# Patient Record
Sex: Female | Born: 1985
Health system: Southern US, Community
[De-identification: ages and names within clinical notes are randomized; demographics above are authoritative.]

## PROBLEM LIST (undated history)

## (undated) ENCOUNTER — Inpatient Hospital Stay (HOSPITAL_COMMUNITY): Payer: 59

## (undated) DIAGNOSIS — M722 Plantar fascial fibromatosis: Secondary | ICD-10-CM

## (undated) DIAGNOSIS — L409 Psoriasis, unspecified: Secondary | ICD-10-CM

## (undated) DIAGNOSIS — L219 Seborrheic dermatitis, unspecified: Secondary | ICD-10-CM

## (undated) HISTORY — DX: Plantar fascial fibromatosis: M72.2

## (undated) HISTORY — PX: REFRACTIVE SURGERY: SHX103

## (undated) HISTORY — DX: Psoriasis, unspecified: L40.9

## (undated) HISTORY — DX: Seborrheic dermatitis, unspecified: L21.9

---

## 2013-06-23 ENCOUNTER — Ambulatory Visit (INDEPENDENT_AMBULATORY_CARE_PROVIDER_SITE_OTHER): Payer: 59 | Admitting: Physician Assistant

## 2013-06-23 ENCOUNTER — Encounter: Payer: Self-pay | Admitting: Physician Assistant

## 2013-06-23 VITALS — BP 106/57 | HR 84 | Ht 62.0 in | Wt 103.0 lb

## 2013-06-23 DIAGNOSIS — Z0000 Encounter for general adult medical examination without abnormal findings: Secondary | ICD-10-CM

## 2013-06-23 NOTE — Progress Notes (Signed)
  Subjective:     Haley Kirby is a 27 y.o. female and is here for a comprehensive physical exam. The patient reports no problems.  History   Social History  . Marital Status: Unknown    Spouse Name: N/A    Number of Children: N/A  . Years of Education: N/A   Occupational History  . Not on file.   Social History Main Topics  . Smoking status: Never Smoker   . Smokeless tobacco: Not on file  . Alcohol Use: Yes  . Drug Use: No  . Sexual Activity: Yes   Other Topics Concern  . Not on file   Social History Narrative  . No narrative on file   No health maintenance topics applied.  The following portions of the patient's history were reviewed and updated as appropriate: allergies, current medications, past family history, past medical history, past social history, past surgical history and problem list.  Review of Systems A comprehensive review of systems was negative.   Objective:    BP 106/57  Pulse 84  Ht 5\' 2"  (1.575 m)  Wt 103 lb (46.72 kg)  BMI 18.83 kg/m2 General appearance: alert, cooperative and appears stated age Head: Normocephalic, without obvious abnormality, atraumatic Eyes: conjunctivae/corneas clear. PERRL, EOM's intact. Fundi benign. Ears: normal TM's and external ear canals both ears Nose: Nares normal. Septum midline. Mucosa normal. No drainage or sinus tenderness. Throat: lips, mucosa, and tongue normal; teeth and gums normal Neck: no adenopathy, no carotid bruit, no JVD, supple, symmetrical, trachea midline and thyroid not enlarged, symmetric, no tenderness/mass/nodules Back: symmetric, no curvature. ROM normal. No CVA tenderness. Lungs: normal percussion bilaterally Heart: regular rate and rhythm, S1, S2 normal, no murmur, click, rub or gallop Abdomen: soft, non-tender; bowel sounds normal; no masses,  no organomegaly Extremities: extremities normal, atraumatic, no cyanosis or edema Pulses: 2+ and symmetric Skin: Skin color, texture, turgor  normal. No rashes or lesions Lymph nodes: Cervical, supraclavicular, and axillary nodes normal. Neurologic: Grossly normal    Assessment:    Healthy female exam.      Plan:    CPE- Per pt fasting labs were done a year ago and normal. Flu and tetanus up to date. Pap smear up to date. Encouraged pt to take calcium and vitamin D daily. Encouraged regular exercise. Gave handout on health maintenance.  See After Visit Summary for Counseling Recommendations

## 2013-06-23 NOTE — Patient Instructions (Signed)

## 2013-09-06 ENCOUNTER — Encounter: Payer: Self-pay | Admitting: Physician Assistant

## 2013-09-06 ENCOUNTER — Ambulatory Visit (INDEPENDENT_AMBULATORY_CARE_PROVIDER_SITE_OTHER): Payer: 59 | Admitting: Physician Assistant

## 2013-09-06 VITALS — BP 108/68 | HR 81 | Wt 104.0 lb

## 2013-09-06 DIAGNOSIS — L218 Other seborrheic dermatitis: Secondary | ICD-10-CM

## 2013-09-06 DIAGNOSIS — L219 Seborrheic dermatitis, unspecified: Secondary | ICD-10-CM

## 2013-09-06 HISTORY — DX: Seborrheic dermatitis, unspecified: L21.9

## 2013-09-06 MED ORDER — CLOBETASOL PROPIONATE 0.05 % EX OINT: 1.0000 "application " | TOPICAL_OINTMENT | Freq: Two times a day (BID) | CUTANEOUS | Status: DC

## 2013-09-06 MED ORDER — KETOCONAZOLE 2 % EX SHAM: 1.0000 "application " | MEDICATED_SHAMPOO | CUTANEOUS | Status: DC

## 2013-09-06 NOTE — Patient Instructions (Signed)
Seborrheic Dermatitis Seborrheic dermatitis involves pink or red skin with greasy, flaky scales. This is often found on the scalp, eyebrows, nose, bearded area, and on or behind the ears. It can also occur on the central chest. It often occurs where there are more oil (sebaceous) glands. This condition is also known as dandruff. When this condition affects a baby's scalp, it is called cradle cap. It may come and go for no known reason. It can occur at any time of life from infancy to old age. CAUSES  The cause is unknown. It is not the result of too little moisture or too much oil. In some people, seborrheic dermatitis flare-ups seem to be triggered by stress. It also commonly occurs in people with certain diseases such as Parkinson's disease or HIV/AIDS. SYMPTOMS   Thick scales on the scalp.  Redness on the face or in the armpits.  The skin may seem oily or dry, but moisturizers do not help.  In infants, seborrheic dermatitis appears as scaly redness that does not seem to bother the baby. In some babies, it affects only the scalp. In others, it also affects the neck creases, armpits, groin, or behind the ears.  In adults and adolescents, seborrheic dermatitis may affect only the scalp. It may look patchy or spread out, with areas of redness and flaking. Other areas commonly affected include:  Eyebrows.  Eyelids.  Forehead.  Skin behind the ears.  Outer ears.  Chest.  Armpits.  Nose creases.  Skin creases under the breasts.  Skin between the buttocks.  Groin.  Some adults and adolescents feel itching or burning in the affected areas. DIAGNOSIS  Your caregiver can usually tell what the problem is by doing a physical exam. TREATMENT   Cortisone (steroid) ointments, creams, and lotions can help decrease inflammation.  Babies can be treated with baby oil to soften the scales, then they may be washed with baby shampoo. If this does not help, a prescription topical steroid  medicine may work.  Adults can use medicated shampoos.  Your caregiver may prescribe corticosteroid cream and shampoo containing an antifungal or yeast medicine (ketoconazole). Hydrocortisone or anti-yeast cream can be rubbed directly onto seborrheic dermatitis patches. Yeast does not cause seborrheic dermatitis, but it seems to add to the problem. In infants, seborrheic dermatitis is often worst during the first year of life. It tends to disappear on its own as the child grows. However, it may return during the teenage years. In adults and adolescents, seborrheic dermatitis tends to be a long-lasting condition that comes and goes over many years. HOME CARE INSTRUCTIONS   Use prescribed medicines as directed.  In infants, do not aggressively remove the scales or flakes on the scalp with a comb or by other means. This may lead to hair loss. SEEK MEDICAL CARE IF:   The problem does not improve from the medicated shampoos, lotions, or other medicines given by your caregiver.  You have any other questions or concerns. Document Released: 07/27/2005 Document Revised: 01/26/2012 Document Reviewed: 12/16/2009 [facility] Patient Information 2014 [place].

## 2013-09-06 NOTE — Progress Notes (Signed)
   Subjective:    Patient ID: Haley Kirby, female    DOB: 1986-05-17, 28 y.o.   MRN: [id number]  HPI Patient is a 28 year old female who presents to the clinic with itchy scalp at the back of her neck around the hairline. She's had this before and was treated with an ointment. The last time she had it was because she was using herbal essence shampoo. She recently switched to pain pain and thinks it could be causing the inflammation. She is has symptoms for the last 2 months. She's tried over-the-counter hydrocortisone and not seem to be helping.  Review of Systems     Objective:   Physical Exam  Constitutional: She is oriented to person, place, and time. She appears well-developed and well-nourished.  HENT:  Head: Normocephalic and atraumatic.  Cardiovascular: Normal rate.   Neurological: She is alert and oriented to person, place, and time.  Skin:  Scalp line at back of neck present with erythematous scaly patches.   Psychiatric: She has a normal mood and affect. Her behavior is normal.          Assessment & Plan:  Seborrheic dermatitis- treated with she did consult shampoo twice a week and clobetasol ointment up to twice a day. Patient given handout on seborrheic dermatitis. Followup if not improving in the next 3-4 weeks.

## 2013-10-02 ENCOUNTER — Telehealth: Payer: Self-pay | Admitting: *Deleted

## 2013-10-02 NOTE — Telephone Encounter (Signed)
Pt left message stating that she has the implanon & she had a period that started on the 6th and went to the 14th, the a few days ago had another period.  Both have been pretty heavy.  She wants to know if this is normal or if she needs to come in.  Please advise.

## 2013-10-03 NOTE — Telephone Encounter (Signed)
Pt.notified

## 2013-10-03 NOTE — Telephone Encounter (Signed)
It is not what we want to happen but is a common side effect of implanon. Some people have had taken out due to unpredictable bleeding. It is not worrisome but also not a fun side effect. You do not need to see me. Sometimes this will regulate itself. i would contact OB if continues and maybe discuss other birth control options.

## 2013-10-23 ENCOUNTER — Other Ambulatory Visit: Payer: Self-pay | Admitting: Physician Assistant

## 2013-10-25 LAB — URINE CULTURE

## 2013-12-07 ENCOUNTER — Encounter: Payer: 59 | Admitting: Obstetrics & Gynecology

## 2013-12-14 ENCOUNTER — Other Ambulatory Visit: Payer: Self-pay | Admitting: Physician Assistant

## 2014-05-04 ENCOUNTER — Telehealth: Payer: Self-pay | Admitting: *Deleted

## 2014-05-04 ENCOUNTER — Other Ambulatory Visit: Payer: Self-pay | Admitting: Physician Assistant

## 2014-05-04 MED ORDER — FLUOCINONIDE 0.05 % EX GEL: 1.0000 "application " | Freq: Two times a day (BID) | CUTANEOUS | Status: DC

## 2014-05-04 NOTE — Telephone Encounter (Signed)
Pt left vm stating that the shampoo & cream you gave her for seborrheic dermatitis before hasn't really helped.  She stated on the vm that a previous dr had given her nizerol shampoo & bactroban and that had worked.  I called pt back to let her know that you had prescribed the same shampoo.  Do you have any other suggestions or do you need to her come in for a visit? Please advise.

## 2014-05-04 NOTE — Telephone Encounter (Signed)
LMOM for pt to return call. 

## 2014-05-04 NOTE — Telephone Encounter (Signed)
Try different steroid gel for scalp called lidex.  Try with OTC coal tar shampoo 3 times weekly lather and rinse.

## 2014-05-07 NOTE — Telephone Encounter (Signed)
Pt.notified

## 2014-05-14 ENCOUNTER — Encounter: Payer: Self-pay | Admitting: Physician Assistant

## 2014-05-14 ENCOUNTER — Ambulatory Visit (INDEPENDENT_AMBULATORY_CARE_PROVIDER_SITE_OTHER): Payer: 59 | Admitting: Physician Assistant

## 2014-05-14 VITALS — BP 99/65 | HR 76 | Wt 106.0 lb

## 2014-05-14 DIAGNOSIS — L600 Ingrowing nail: Secondary | ICD-10-CM

## 2014-05-14 NOTE — Progress Notes (Signed)
   Subjective:    Patient ID: Haley Kirby, female    DOB: 1985/10/01, 28 y.o.   MRN: [id number]  HPI Pt presents to the clinic with right great ingrown toenail. She has them off and on. She most of the time can get rid of herself. She has not been able to do anything with this on. She has on and off pain for last 3-6 months. She wants it gone.    Review of Systems  All other systems reviewed and are negative.      Objective:   Physical Exam  Constitutional: She appears well-developed and well-nourished.  Pulmonary/Chest: Effort normal and breath sounds normal.  Skin:  Right great toe lateral edge ingrown toenail. Slight redness and erythema of lateral nail edge.           Assessment & Plan:  Right ingrown great toenail- Toenail Avulsion Procedure Note  Pre-operative Diagnosis: Right Ingrown Great toenail lateral nail edge  Post-operative Diagnosis: right Ingrown Great toenail lateral nail edge  Indications: pain/tenderness/redness  Anesthesia: Lidocaine 1% without epinephrine without added sodium bicarbonate  Procedure Details  History of allergy to iodine: no   The risks (including bleeding and infection) and benefits of the  procedure and Verbal informed consent obtained.  After digital block anesthesia was obtained, a tourniquet was applied for hemostasis during the procedure.  After prepping with Betadine, the offending edge of the nail was freed from the nailbed and perionychium, and then split with scissors and removed with  forceps.  All visible granulation tissue is debrided. Antibiotic and bulky dressing was applied.   Findings: Ingrown toenail  Complications: pain.  Plan: 1. Soak the foot twice daily. Change dressing twice daily until healed over. 2. Warning signs of infection were reviewed.   3. Recommended that the patient use OTC acetaminophen as needed for pain.

## 2014-05-14 NOTE — Patient Instructions (Signed)
Ingrown Toenail An ingrown toenail occurs when the sharp edge of your toenail grows into the skin. Causes of ingrown toenails include toenails clipped too far back or poorly fitting shoes. Activities involving sudden stops (basketball, tennis) causing "toe jamming" may lead to an ingrown nail. HOME CARE INSTRUCTIONS   Soak the whole foot in warm soapy water for 20 minutes, 3 times per day.  You may lift the edge of the nail away from the sore skin by wedging a small piece of cotton under the corner of the nail. Be careful not to dig (traumatize) and cause more injury to the area.  Wear shoes that fit well. While the ingrown nail is causing problems, sandals may be beneficial.  Trim your toenails regularly and carefully. Cut your toenails straight across, not in a curve. This will prevent injury to the skin at the corners of the toenail.  Keep your feet clean and dry.  Crutches may be helpful early in treatment if walking is painful.  Antibiotics, if prescribed, should be taken as directed.  Return for a wound check in 2 days or as directed.  Only take over-the-counter or prescription medicines for pain, discomfort, or fever as directed by your caregiver. SEEK IMMEDIATE MEDICAL CARE IF:   You have a fever.  You have increasing pain, redness, swelling, or heat at the wound site.  Your toe is not better in 7 days. If conservative treatment is not successful, surgical removal of a portion or all of the nail may be necessary. MAKE SURE YOU:   Understand these instructions.  Will watch your condition.  Will get help right away if you are not doing well or get worse. Document Released: 07/24/2000 Document Revised: 10/19/2011 Document Reviewed: 07/18/2008 [employer] Patient Information 2015 [employer]. This information is not intended to replace advice given to you by your health care provider. Make sure you discuss any questions you have with your health care provider.  

## 2014-07-16 ENCOUNTER — Ambulatory Visit (INDEPENDENT_AMBULATORY_CARE_PROVIDER_SITE_OTHER): Payer: 59 | Admitting: Physician Assistant

## 2014-07-16 ENCOUNTER — Encounter: Payer: Self-pay | Admitting: Physician Assistant

## 2014-07-16 VITALS — BP 104/69 | HR 75 | Ht 62.0 in | Wt 106.0 lb

## 2014-07-16 DIAGNOSIS — Z0000 Encounter for general adult medical examination without abnormal findings: Secondary | ICD-10-CM

## 2014-07-16 DIAGNOSIS — L219 Seborrheic dermatitis, unspecified: Secondary | ICD-10-CM

## 2014-07-16 DIAGNOSIS — L218 Other seborrheic dermatitis: Secondary | ICD-10-CM

## 2014-07-16 DIAGNOSIS — Z131 Encounter for screening for diabetes mellitus: Secondary | ICD-10-CM

## 2014-07-16 DIAGNOSIS — Z13220 Encounter for screening for lipoid disorders: Secondary | ICD-10-CM

## 2014-07-16 MED ORDER — CICLOPIROX 1 % EX SHAM: MEDICATED_SHAMPOO | CUTANEOUS | Status: DC

## 2014-07-16 MED ORDER — AZITHROMYCIN 250 MG PO TABS: ORAL_TABLET | ORAL | Status: DC

## 2014-07-16 NOTE — Patient Instructions (Signed)

## 2014-07-18 NOTE — Progress Notes (Signed)
  Subjective:     Haley Kirby is a 28 y.o. female and is here for a comprehensive physical exam. The patient reports problems - continues to have problems with itchy, scaly scalp. using creams and shampoos given previously. does seem to help but does not go away. .  History   Social History  . Marital Status: Unknown    Spouse Name: N/A    Number of Children: N/A  . Years of Education: N/A   Occupational History  . Not on file.   Social History Main Topics  . Smoking status: Never Smoker   . Smokeless tobacco: Not on file  . Alcohol Use: Yes  . Drug Use: No  . Sexual Activity: Yes   Other Topics Concern  . Not on file   Social History Narrative   Health Maintenance  Topic Date Due  . Samul Dada  11/26/2006  . PAP SMEAR  08/10/2013  . INFLUENZA VACCINE  03/11/2015    The following portions of the patient's history were reviewed and updated as appropriate: allergies, current medications, past family history, past medical history, past social history, past surgical history and problem list.  Review of Systems A comprehensive review of systems was negative.   Objective:    General appearance: alert and cooperative Head: Normocephalic, without obvious abnormality, atraumatic Eyes: conjunctivae/corneas clear. PERRL, EOM's intact. Fundi benign. Ears: normal TM's and external ear canals both ears Nose: Nares normal. Septum midline. Mucosa normal. No drainage or sinus tenderness. Throat: lips, mucosa, and tongue normal; teeth and gums normal Neck: no adenopathy, no carotid bruit, no JVD, supple, symmetrical, trachea midline and thyroid not enlarged, symmetric, no tenderness/mass/nodules Back: symmetric, no curvature. ROM normal. No CVA tenderness. Lungs: clear to auscultation bilaterally Heart: regular rate and rhythm, S1, S2 normal, no murmur, click, rub or gallop Abdomen: soft, non-tender; bowel sounds normal; no masses,  no organomegaly Extremities: extremities normal,  atraumatic, no cyanosis or edema Pulses: 2+ and symmetric Skin: Skin color, texture, turgor normal. No rashes or lesions Lymph nodes: Cervical, supraclavicular, and axillary nodes normal. Neurologic: Grossly normal    Scaly, dry, itchy, erythematous patches at base of scalp.    Assessment:    Healthy female exam.      Plan:  CPE- vaccines up to date. Pap up to date. Fasting labs ordered. Encouraged regular diet and exercise. Calcium and vitamin D encouraged 1200mg  and vitamin d 800 units.   Seborrheic dermaitis- gave new shampoo to try in combination with lidex gel.    See After Visit Summary for Counseling Recommendations

## 2014-07-19 ENCOUNTER — Ambulatory Visit: Payer: Self-pay

## 2014-07-19 LAB — LIPID PANEL
Cholesterol: 155 mg/dL (ref 0–200)
Cholesterol: 155 mg/dL (ref 0–200)
HDL: 43 mg/dL (ref 35–70)
HDL: 43 mg/dL (ref 40–60)
LDL Cholesterol: 100 mg/dL
Ldl Cholesterol, Calc: 100 mg/dL (ref 0–100)
TRIGLYCERIDES: 60 mg/dL (ref 0–200)
Triglycerides: 60 mg/dL (ref 40–160)
VLDL CHOLESTEROL, CALC: 12 mg/dL (ref 5–40)

## 2014-07-19 LAB — COMPREHENSIVE METABOLIC PANEL
ALK PHOS: 57 U/L
ALT: 27 U/L
ANION GAP: 7 (ref 7–16)
AST: 26 U/L (ref 15–37)
Albumin: 3.9 g/dL (ref 3.4–5.0)
BUN: 9 mg/dL (ref 7–18)
Bilirubin,Total: 0.7 mg/dL (ref 0.2–1.0)
CALCIUM: 8.7 mg/dL (ref 8.5–10.1)
CHLORIDE: 106 mmol/L (ref 98–107)
Co2: 28 mmol/L (ref 21–32)
Creatinine: 0.8 mg/dL (ref 0.60–1.30)
EGFR (Non-African Amer.): >60
GLUCOSE: 81 mg/dL (ref 65–99)
Osmolality: 279 (ref 275–301)
POTASSIUM: 4.1 mmol/L (ref 3.5–5.1)
Sodium: 141 mmol/L (ref 136–145)
Total Protein: 7.2 g/dL (ref 6.4–8.2)

## 2014-07-19 LAB — CMP14+LP+1AC+CBC/D/PLT+TSH
ALBUMIN: 3.9
CO2, serum: 28
Calcium: 8.7 mg/dL
Chloride, Serum: 106
PROTEIN, TOTAL - PEBFLD: 7.2
Total Bilirubin: 0.7 mg/dL

## 2014-07-19 LAB — HEPATIC FUNCTION PANEL
ALT: 27 U/L (ref 7–35)
AST: 26 U/L (ref 13–35)
Alkaline Phosphatase: 57 U/L (ref 25–125)

## 2014-07-19 LAB — BASIC METABOLIC PANEL
BUN: 9 mg/dL (ref 4–21)
Creatinine: 0.8 mg/dL (ref 0.5–1.1)
GLUCOSE: 81 mg/dL
Potassium: 4.1 mmol/L (ref 3.4–5.3)
Sodium: 141 mmol/L (ref 137–147)

## 2014-07-20 ENCOUNTER — Other Ambulatory Visit: Payer: Self-pay | Admitting: *Deleted

## 2014-07-27 ENCOUNTER — Telehealth: Payer: Self-pay | Admitting: Physician Assistant

## 2014-07-27 NOTE — Telephone Encounter (Signed)
Got labs from [facility]: LDL 100, HDL 43, TG looks great, glucose great, kidney, liver all good.   Wonderful labs.

## 2014-07-27 NOTE — Telephone Encounter (Signed)
Left results on vm.

## 2014-09-10 ENCOUNTER — Other Ambulatory Visit: Payer: Self-pay | Admitting: Physician Assistant

## 2014-10-19 ENCOUNTER — Encounter: Payer: Self-pay | Admitting: Physician Assistant

## 2014-11-12 ENCOUNTER — Telehealth: Payer: Self-pay | Admitting: *Deleted

## 2014-11-13 NOTE — Telephone Encounter (Signed)
Did treatment back in 2015 help? Has it been ongoing for over a year? Does it come and go or stay constant?

## 2014-11-14 ENCOUNTER — Other Ambulatory Visit: Payer: Self-pay | Admitting: Physician Assistant

## 2014-11-14 DIAGNOSIS — L219 Seborrheic dermatitis, unspecified: Secondary | ICD-10-CM

## 2014-11-14 NOTE — Telephone Encounter (Signed)
Haley Kirby, CMA at 11/14/2014 4:44 PM     Status: Signed       Expand All Collapse All   Spoke to pt and she said that the treatment from last year didn't help and it's been ongoing for over a year now & it's constant.

## 2014-11-14 NOTE — Telephone Encounter (Signed)
Treated with best medication for seborrheic dermatitis/atopic dermititis I will send to dermatology.

## 2014-11-14 NOTE — Telephone Encounter (Signed)
Spoke to pt and she said that the treatment from last year didn't help and it's been ongoing for over a year now & it's constant.

## 2014-11-15 NOTE — Telephone Encounter (Signed)
LMOM notifying pt of referral. 

## 2014-12-14 ENCOUNTER — Encounter: Payer: Self-pay | Admitting: Physician Assistant

## 2014-12-14 DIAGNOSIS — L409 Psoriasis, unspecified: Secondary | ICD-10-CM | POA: Insufficient documentation

## 2014-12-14 HISTORY — DX: Psoriasis, unspecified: L40.9

## 2015-01-21 ENCOUNTER — Ambulatory Visit (INDEPENDENT_AMBULATORY_CARE_PROVIDER_SITE_OTHER): Payer: 59 | Admitting: Sports Medicine

## 2015-01-21 ENCOUNTER — Encounter: Payer: Self-pay | Admitting: Sports Medicine

## 2015-01-21 VITALS — BP 115/72 | HR 75 | Ht 62.0 in | Wt 111.0 lb

## 2015-01-21 DIAGNOSIS — M722 Plantar fascial fibromatosis: Secondary | ICD-10-CM

## 2015-01-21 HISTORY — DX: Plantar fascial fibromatosis: M72.2

## 2015-01-21 MED ORDER — MELOXICAM 15 MG PO TABS: ORAL_TABLET | ORAL | Status: DC

## 2015-01-21 NOTE — Progress Notes (Signed)
   Subjective:    I'm seeing this patient as a consultation for:  Iran Planas, PA-C  CC: right foot pain  HPI: For several months this pleasant 29 year old female has had pain that she localizes on the plantar aspect of the right foot, moderate, persistent without radiation. 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  Past medical history, Surgical history, Family history not pertinant except as noted below, Social history, Allergies, and medications have been entered into the medical record, reviewed, and no changes needed.   Review of Systems: No headache, visual changes, nausea, vomiting, diarrhea, constipation, dizziness, abdominal pain, skin rash, fevers, chills, night sweats, weight loss, swollen lymph nodes, body aches, joint swelling, muscle aches, chest pain, shortness of breath, mood changes, visual or auditory hallucinations.   Objective:   General: Well Developed, well nourished, and in no acute distress.  Neuro/Psych: Alert and oriented x3, extra-ocular muscles intact, able to move all 4 extremities, sensation grossly intact. Skin: Warm and dry, no rashes noted.  Respiratory: Not using accessory muscles, speaking in full sentences, trachea midline.  Cardiovascular: Pulses palpable, no extremity edema. Abdomen: Does not appear distended. Right Foot: No visible erythema or swelling. Range of motion is full in all directions. Strength is 5/5 in all directions. No hallux valgus. No pes cavus or pes planus. No abnormal callus noted. No pain over the navicular prominence, or base of fifth metatarsal. No tenderness to palpation of the calcaneal insertion of plantar fascia. No pain at the Achilles insertion. No pain over the calcaneal bursa. No pain of the retrocalcaneal bursa. No tenderness to palpation over the tarsals, metatarsals, or phalanges. No hallux rigidus or limitus. No tenderness palpation over interphalangeal joints. No pain with  compression of the metatarsal heads. Visible breakdown of the transverse arch with clawing of the toes and abnormal callus between the second through fourth metatarsal heads Neurovascularly intact distally.  Impression and Recommendations:   This case required medical decision making of moderate complexity.

## 2015-01-21 NOTE — Assessment & Plan Note (Signed)
Return for custom orthotics. Meloxicam, home rehabilitation exercises.

## 2015-02-05 ENCOUNTER — Ambulatory Visit (INDEPENDENT_AMBULATORY_CARE_PROVIDER_SITE_OTHER): Payer: 59 | Admitting: Sports Medicine

## 2015-02-05 ENCOUNTER — Encounter: Payer: Self-pay | Admitting: Sports Medicine

## 2015-02-05 VITALS — BP 107/67 | HR 73 | Wt 112.0 lb

## 2015-02-05 DIAGNOSIS — M722 Plantar fascial fibromatosis: Secondary | ICD-10-CM

## 2015-02-05 NOTE — Progress Notes (Signed)

## 2015-02-05 NOTE — Assessment & Plan Note (Signed)
50-75% better with meloxicam and rehabilitation exercises. Custom orthotics as above. Return in one month.

## 2015-03-12 ENCOUNTER — Ambulatory Visit (INDEPENDENT_AMBULATORY_CARE_PROVIDER_SITE_OTHER): Payer: 59 | Admitting: Sports Medicine

## 2015-03-12 ENCOUNTER — Encounter: Payer: Self-pay | Admitting: Sports Medicine

## 2015-03-12 VITALS — BP 106/68 | HR 79 | Ht 62.0 in | Wt 113.0 lb

## 2015-03-12 DIAGNOSIS — M722 Plantar fascial fibromatosis: Secondary | ICD-10-CM | POA: Diagnosis not present

## 2015-03-12 NOTE — Progress Notes (Signed)
  Subjective:    CC: Follow-up  HPI: Lashe returns, she has done the rehabilitation exercises and has custom orthotics for plantar fasciitis, overall she is pain-free, only has a bit of discomfort at the end of the day, she feels as though she cannot fit the orthotics in a couple of her shoes.  Past medical history, Surgical history, Family history not pertinant except as noted below, Social history, Allergies, and medications have been entered into the medical record, reviewed, and no changes needed.   Review of Systems: No fevers, chills, night sweats, weight loss, chest pain, or shortness of breath.   Objective:    General: Well Developed, well nourished, and in no acute distress.  Neuro: Alert and oriented x3, extra-ocular muscles intact, sensation grossly intact.  HEENT: Normocephalic, atraumatic, pupils equal round reactive to light, neck supple, no masses, no lymphadenopathy, thyroid nonpalpable.  Skin: Warm and dry, no rashes. Cardiac: Regular rate and rhythm, no murmurs rubs or gallops, no lower extremity edema.  Respiratory: Clear to auscultation bilaterally. Not using accessory muscles, speaking in full sentences.  I shortened the orthotics approximately 1/4 inch and planed them down at the tip  Impression and Recommendations:    I spent 25 minutes with this patient, greater than 50% was face-to-face time counseling regarding the above diagnoses

## 2015-03-12 NOTE — Assessment & Plan Note (Signed)
Over 90% improved, essentially pain free, still has a bit of difficulty fitting her orthotics in her flats and Danskos. I planed down and shortened the orthotics a bit, she will also return to pick up a night splints

## 2015-07-02 ENCOUNTER — Encounter: Payer: Self-pay | Admitting: Sports Medicine

## 2015-07-02 ENCOUNTER — Ambulatory Visit (INDEPENDENT_AMBULATORY_CARE_PROVIDER_SITE_OTHER): Payer: 59 | Admitting: Sports Medicine

## 2015-07-02 VITALS — BP 109/72 | HR 65 | Wt 113.0 lb

## 2015-07-02 DIAGNOSIS — M722 Plantar fascial fibromatosis: Secondary | ICD-10-CM

## 2015-07-02 NOTE — Progress Notes (Signed)

## 2015-07-02 NOTE — Assessment & Plan Note (Addendum)
Doing well, new set of custom orthotics as above.

## 2015-07-19 ENCOUNTER — Ambulatory Visit (INDEPENDENT_AMBULATORY_CARE_PROVIDER_SITE_OTHER): Payer: 59 | Admitting: Physician Assistant

## 2015-07-19 ENCOUNTER — Other Ambulatory Visit (HOSPITAL_COMMUNITY)
Admission: RE | Admit: 2015-07-19 | Discharge: 2015-07-19 | Disposition: A | Discharge status: Home / Self Care | Payer: 59 | Source: Ambulatory Visit | Attending: Family Medicine | Admitting: Family Medicine

## 2015-07-19 ENCOUNTER — Encounter: Payer: Self-pay | Admitting: Physician Assistant

## 2015-07-19 VITALS — BP 111/51 | HR 74 | Ht 62.0 in | Wt 112.0 lb

## 2015-07-19 DIAGNOSIS — Z131 Encounter for screening for diabetes mellitus: Secondary | ICD-10-CM | POA: Diagnosis not present

## 2015-07-19 DIAGNOSIS — Z0000 Encounter for general adult medical examination without abnormal findings: Secondary | ICD-10-CM

## 2015-07-19 DIAGNOSIS — Z01419 Encounter for gynecological examination (general) (routine) without abnormal findings: Secondary | ICD-10-CM | POA: Insufficient documentation

## 2015-07-19 LAB — COMPLETE METABOLIC PANEL WITH GFR
ALBUMIN: 4.4 g/dL (ref 3.6–5.1)
ALK PHOS: 43 U/L (ref 33–115)
ALT: 16 U/L (ref 6–29)
AST: 19 U/L (ref 10–30)
BILIRUBIN TOTAL: 0.6 mg/dL (ref 0.2–1.2)
BUN: 9 mg/dL (ref 7–25)
CALCIUM: 9.3 mg/dL (ref 8.6–10.2)
CHLORIDE: 101 mmol/L (ref 98–110)
CO2: 26 mmol/L (ref 20–31)
CREATININE: 0.82 mg/dL (ref 0.50–1.10)
GFR, Est Non African American: >89 mL/min (ref >=60)
Glucose, Bld: 76 mg/dL (ref 65–99)
Potassium: 4.2 mmol/L (ref 3.5–5.3)
Sodium: 137 mmol/L (ref 135–146)
TOTAL PROTEIN: 7 g/dL (ref 6.1–8.1)

## 2015-07-19 MED ORDER — NORETHINDRONE ACET-ETHINYL EST 1-20 MG-MCG PO TABS: 1.0000 | ORAL_TABLET | Freq: Every day | ORAL | Status: DC

## 2015-07-19 NOTE — Progress Notes (Signed)
  Subjective:     Haley Kirby is a 29 y.o. female and is here for a comprehensive physical exam. The patient reports no problems.  Social History   Social History  . Marital Status: Married    Spouse Name: N/A  . Number of Children: N/A  . Years of Education: N/A   Occupational History  . Not on file.   Social History Main Topics  . Smoking status: Never Smoker   . Smokeless tobacco: Not on file  . Alcohol Use: Yes  . Drug Use: No  . Sexual Activity: Yes   Other Topics Concern  . Not on file   Social History Narrative   Health Maintenance  Topic Date Due  . PAP SMEAR  08/10/2013  . HIV Screening  07/18/2016 (Originally 11/26/2002)  . INFLUENZA VACCINE  03/10/2016  . TETANUS/TDAP  03/10/2025    The following portions of the patient's history were reviewed and updated as appropriate: allergies, current medications, past family history, past medical history, past social history, past surgical history and problem list.  Review of Systems Pertinent items noted in HPI and remainder of comprehensive ROS otherwise negative.   Objective:    BP 111/51 mmHg  Pulse 74  Ht 5\' 2"  (1.575 m)  Wt 112 lb (50.803 kg)  BMI 20.48 kg/m2  LMP 07/09/2015 General appearance: alert, cooperative and appears stated age Head: Normocephalic, without obvious abnormality, atraumatic Eyes: conjunctivae/corneas clear. PERRL, EOM's intact. Fundi benign. Ears: normal TM's and external ear canals both ears Nose: Nares normal. Septum midline. Mucosa normal. No drainage or sinus tenderness. Throat: lips, mucosa, and tongue normal; teeth and gums normal Neck: no adenopathy, no carotid bruit, no JVD, supple, symmetrical, trachea midline and thyroid not enlarged, symmetric, no tenderness/mass/nodules Back: symmetric, no curvature. ROM normal. No CVA tenderness. Lungs: clear to auscultation bilaterally Breasts: normal appearance, no masses or tenderness Heart: regular rate and rhythm, S1, S2 normal, no  murmur, click, rub or gallop Abdomen: soft, non-tender; bowel sounds normal; no masses,  no organomegaly Pelvic: cervix normal in appearance, external genitalia normal, no adnexal masses or tenderness, no cervical motion tenderness, uterus normal size, shape, and consistency and vagina normal without discharge Extremities: extremities normal, atraumatic, no cyanosis or edema Pulses: 2+ and symmetric Skin: Skin color, texture, turgor normal. No rashes or lesions Lymph nodes: Cervical, supraclavicular, and axillary nodes normal. Neurologic: Grossly normal    Assessment:    Healthy female exam.      Plan:    CPE- LdL wonderful 1 year ago will not check this year. cmp ordered to screen for diabetes. HIV screening declined. Pap done today. Discussed vitamin D 800 units and calcium 1500mg  daily. Continue daily exercise.  See After Visit Summary for Counseling Recommendations

## 2015-07-19 NOTE — Patient Instructions (Signed)

## 2015-07-22 LAB — CYTOLOGY - PAP

## 2015-08-15 ENCOUNTER — Ambulatory Visit: Payer: Self-pay | Admitting: Physician Assistant

## 2015-08-15 ENCOUNTER — Encounter: Payer: Self-pay | Admitting: Physician Assistant

## 2015-08-15 VITALS — BP 100/70 | HR 74 | Resp 98

## 2015-08-15 DIAGNOSIS — M26609 Unspecified temporomandibular joint disorder, unspecified side: Secondary | ICD-10-CM

## 2015-08-15 MED ORDER — METHYLPREDNISOLONE 4 MG PO TBPK: ORAL_TABLET | ORAL | Status: DC

## 2015-08-15 NOTE — Progress Notes (Signed)
S: c/o left jaw pain, has tmj, has been taking tylenol and motrin without relief, no known injury, pain increased with opening and closing jaw, no popping or clicking  O: vitals wnl, nad, left jaw a little tender at tmj, full rom, bite is normal, lungs c t a, cv rrr  A: tmj syndrome  P: medrol dose pack, f/u with reg dentist, ask about mouth guard

## 2015-10-29 ENCOUNTER — Encounter: Payer: Self-pay | Admitting: Physician Assistant

## 2015-10-29 ENCOUNTER — Ambulatory Visit (INDEPENDENT_AMBULATORY_CARE_PROVIDER_SITE_OTHER): Payer: 59 | Admitting: Physician Assistant

## 2015-10-29 VITALS — BP 105/57 | Temp 97.9°F | Ht 62.0 in | Wt 112.0 lb

## 2015-10-29 DIAGNOSIS — J029 Acute pharyngitis, unspecified: Secondary | ICD-10-CM | POA: Diagnosis not present

## 2015-10-29 DIAGNOSIS — Z2089 Contact with and (suspected) exposure to other communicable diseases: Secondary | ICD-10-CM

## 2015-10-29 DIAGNOSIS — Z20818 Contact with and (suspected) exposure to other bacterial communicable diseases: Secondary | ICD-10-CM

## 2015-10-29 MED ORDER — AMOXICILLIN-POT CLAVULANATE 875-125 MG PO TABS: 1.0000 | ORAL_TABLET | Freq: Two times a day (BID) | ORAL | Status: DC

## 2015-10-29 NOTE — Progress Notes (Signed)
   Subjective:    Patient ID: Haley Kirby, female    DOB: 10/26/1985, 30 y.o.   MRN: [id number]  HPI Pt presents to the clinic with ST for 1 day. Her daughter was diagnosed with strep yesterday. All weekend long they shared drinks and food. No fever, chills, body aches. Not tried anything to make better. No cough.    Review of Systems  All other systems reviewed and are negative.      Objective:   Physical Exam  Constitutional: She is oriented to person, place, and time. She appears well-developed and well-nourished.  HENT:  Head: Normocephalic and atraumatic.  Right Ear: External ear normal.  Left Ear: External ear normal.  Nose: Nose normal.  TM's clear.  Erythematous oropharynx without tonsiliar swelling or exudate.   Neck: Normal range of motion. Neck supple.  Mildly enlarged and tender bilateral anterior cervical lymph nodes.   Cardiovascular: Normal rate, regular rhythm and normal heart sounds.   Pulmonary/Chest: Effort normal and breath sounds normal. She has no wheezes.  Lymphadenopathy:    She has cervical adenopathy.  Neurological: She is alert and oriented to person, place, and time.  Psychiatric: She has a normal mood and affect. Her behavior is normal.          Assessment & Plan:  Exposure to strep/ST- will treat with augmentin for 10 days. Discussed symptomatic care. Gargle with salt water.

## 2015-11-06 ENCOUNTER — Telehealth: Payer: Self-pay | Admitting: Family Medicine

## 2015-11-06 MED ORDER — OSELTAMIVIR PHOSPHATE 75 MG PO CAPS: 75.0000 mg | ORAL_CAPSULE | Freq: Every day | ORAL | Status: DC

## 2015-11-06 MED FILL — OSELTAMIVIR PHOS 75 MG CAP: 75 | 10 days supply | Qty: 10 | Fill #0

## 2015-11-06 NOTE — Telephone Encounter (Signed)
Son tested positive

## 2015-11-06 NOTE — Addendum Note (Signed)
Addended by: Marcial Pacas on: 11/06/2015 02:40 PM   Modules accepted: Orders

## 2015-12-21 ENCOUNTER — Encounter: Payer: Self-pay | Admitting: Sports Medicine

## 2016-02-05 ENCOUNTER — Encounter: Payer: Self-pay | Admitting: Physician Assistant

## 2016-02-05 ENCOUNTER — Ambulatory Visit: Payer: Self-pay | Admitting: Physician Assistant

## 2016-02-05 VITALS — BP 100/70 | HR 72 | Temp 98.4°F

## 2016-02-05 DIAGNOSIS — R300 Dysuria: Secondary | ICD-10-CM

## 2016-02-05 DIAGNOSIS — R319 Hematuria, unspecified: Secondary | ICD-10-CM

## 2016-02-05 DIAGNOSIS — N390 Urinary tract infection, site not specified: Secondary | ICD-10-CM

## 2016-02-05 LAB — POCT URINALYSIS DIPSTICK
BILIRUBIN UA: NEGATIVE
Glucose, UA: NEGATIVE
Ketones, UA: NEGATIVE
NITRITE UA: NEGATIVE
PH UA: 6
Protein, UA: NEGATIVE
UROBILINOGEN UA: 0.2

## 2016-02-05 MED ORDER — CIPROFLOXACIN HCL 500 MG PO TABS: 500.0000 mg | ORAL_TABLET | Freq: Two times a day (BID) | ORAL | Status: DC

## 2016-02-05 NOTE — Progress Notes (Signed)
S:  C/o uti sx for 2 days, burning, urgency, frequency, urine was pink colored yesterday, took azo with a little relief today,  denies vaginal discharge, abdominal pain or flank pain:  Remainder ros neg  O:  Vitals wnl, nad, no cva tenderness, back nontender, lungs c t a,cv rrr, abd soft nontender, bs normal, n/v intact  A: uti  P: cipro 500mg  bid x 3d, increase water intake, add cranberry juice, return if not improving in 2 -3 days, return earlier if worsening, discussed pyelonephritis sx

## 2016-04-02 ENCOUNTER — Ambulatory Visit: Payer: Self-pay | Admitting: Physician Assistant

## 2016-04-02 ENCOUNTER — Encounter: Payer: Self-pay | Admitting: Physician Assistant

## 2016-04-02 VITALS — BP 98/72 | HR 60 | Temp 98.4°F

## 2016-04-02 DIAGNOSIS — H60392 Other infective otitis externa, left ear: Secondary | ICD-10-CM

## 2016-04-02 MED ORDER — NEOMYCIN-POLYMYXIN-HC 3.5-10000-1 OT SOLN: 4.0000 [drp] | Freq: Four times a day (QID) | OTIC | 0 refills | Status: DC

## 2016-04-02 NOTE — Progress Notes (Signed)
S: c/o left ear pain, was at the beach over the weekend and in the pool a lot, states ear started to hurt yesterday and couldn't sleep on that side last night due to pain, no fever/chills/cough/congestion, no drainage from her ear  O: vitals wnl, nad, left ear canal swollen, tms clear b/l, nasal mucosa wnl, throat wnl, neck supple no lymph lungs c ta , cv rrr  A: acute otitis externa  P: cortisporin otic drop

## 2016-04-06 DIAGNOSIS — D225 Melanocytic nevi of trunk: Secondary | ICD-10-CM | POA: Diagnosis not present

## 2016-04-06 DIAGNOSIS — L408 Other psoriasis: Secondary | ICD-10-CM | POA: Diagnosis not present

## 2016-06-22 ENCOUNTER — Ambulatory Visit: Payer: Self-pay | Admitting: Physician Assistant

## 2016-06-22 ENCOUNTER — Encounter: Payer: Self-pay | Admitting: Physician Assistant

## 2016-06-22 VITALS — BP 110/60 | HR 85 | Temp 98.5°F

## 2016-06-22 DIAGNOSIS — L03031 Cellulitis of right toe: Secondary | ICD-10-CM

## 2016-06-22 MED ORDER — SULFAMETHOXAZOLE-TRIMETHOPRIM 800-160 MG PO TABS: 1.0000 | ORAL_TABLET | Freq: Two times a day (BID) | ORAL | 0 refills | Status: DC

## 2016-06-22 NOTE — Progress Notes (Addendum)
S: 2 weeks ago someone stepped on her toe with cleat while playing soccer, over the weekend the toenail turned blue so she poked a hole in it to relieve the pressure, bled for about 75minutes, now area is red and swollen, no fever/chills,   O: vitals wnl, nad, skin around nail and distal part of toe on surface is red swollen, tender, a little warm, does not wrap around under toe, n/v intact  A: cellulitis  P: septra ds, warm water soaks  Asked pt yesterday if there was any chance she was pregnant and she said no, she got worried and took a pregnancy test this morning which was +, told pt to not take the septra and we will send rx for keflex to pharmacy

## 2016-06-23 ENCOUNTER — Telehealth: Payer: Self-pay | Admitting: Physician Assistant

## 2016-06-23 MED ORDER — CEPHALEXIN 500 MG PO CAPS: 500.0000 mg | ORAL_CAPSULE | Freq: Three times a day (TID) | ORAL | 0 refills | Status: DC

## 2016-06-23 NOTE — Addendum Note (Signed)
Addended by: Versie Starks on: 06/23/2016 09:25 AM   Modules accepted: Orders

## 2016-06-23 NOTE — Telephone Encounter (Signed)
Will send in rx for keflex, tell her to not take the septra

## 2016-06-23 NOTE — Progress Notes (Signed)
Patient contacted office this morning to inform us that this morning she took a pregnancy test which was positive and was wondering if she can still take the antibiotic prescribed to her . Per Manuela Schwartz will send something else and not to take the septra. Patient acknowledge understanding

## 2016-06-25 ENCOUNTER — Ambulatory Visit (INDEPENDENT_AMBULATORY_CARE_PROVIDER_SITE_OTHER): Payer: 59 | Admitting: Obstetrics and Gynecology

## 2016-06-25 ENCOUNTER — Encounter: Payer: Self-pay | Admitting: Obstetrics and Gynecology

## 2016-06-25 VITALS — BP 99/64 | HR 75 | Ht 62.0 in | Wt 113.7 lb

## 2016-06-25 DIAGNOSIS — Z3200 Encounter for pregnancy test, result unknown: Secondary | ICD-10-CM | POA: Diagnosis not present

## 2016-06-25 DIAGNOSIS — N912 Amenorrhea, unspecified: Secondary | ICD-10-CM

## 2016-06-25 LAB — POCT URINE PREGNANCY: Preg Test, Ur: NEGATIVE

## 2016-06-25 NOTE — Progress Notes (Signed)
   GYNECOLOGY CLINIC PROGRESS NOTE  Subjective:    Haley Kirby is a 30 y.o. [id number] female who presents for evaluation of amenorrhea. She believes she could be pregnant. Pregnancy is desired. Sexual Activity: single partner, contraception: none. Current symptoms also include: nausea and positive home pregnancy test x 2. Last period was normal.  Patient's last menstrual period was 05/25/2016 (exact date).   The following portions of the patient's history were reviewed and updated as appropriate: allergies, current medications, past family history, past medical history, past social history, past surgical history and problem list.  Review of Systems A comprehensive review of systems was negative except for: Integument: positive for cellulitis of right toe after minor sports injury, currently taking antibiotics prescribed by PCP (Keflex)     Objective:    BP 99/64 (BP Location: Left Arm, Patient Position: Sitting, Cuff Size: Normal)   Pulse 75   Ht 5\' 2"  (1.575 m)   Wt 113 lb 11.2 oz (51.6 kg)   LMP 05/25/2016 (Exact Date)   BMI 20.80 kg/m  General: alert, no distress and no acute distress    Lab Review Urine HCG: negative    Assessment:    Absence of menstruation.    Home UPT x 2 positive, with negative office UPT  Plan:    Pregnancy Test: Negative in office with positive home UPT x 2 and amenorrhea.  Discussed that based on LMP, patient is currently 4.3 weeks, with an EDC: of 03/01/2017.  The pregnancy hormone levels may still be slightly lower than what is able to be detected in office.  Will order BHCG. . Briefly discussed pre-natal care options. Pregnancy, Childbirth and the Newborn book given. Encouraged well-balanced diet, plenty of rest when needed, pre-natal vitamins daily and walking for exercise. Discussed self-help for nausea, avoiding OTC medications until consulting provider or pharmacist, other than Tylenol as needed, minimal caffeine (1-2 cups daily) and avoiding alcohol.  She will schedule her NOB intake visit  In 4 weeks if BHCGs normal and positive.    Rubie Maid, MD [facility]

## 2016-06-26 ENCOUNTER — Other Ambulatory Visit: Payer: Self-pay | Admitting: Obstetrics and Gynecology

## 2016-06-26 DIAGNOSIS — Z3200 Encounter for pregnancy test, result unknown: Secondary | ICD-10-CM

## 2016-06-26 LAB — BETA HCG QUANT (REF LAB): hCG Quant: 48 m[IU]/mL

## 2016-06-30 ENCOUNTER — Other Ambulatory Visit: Payer: 59

## 2016-06-30 DIAGNOSIS — Z3200 Encounter for pregnancy test, result unknown: Secondary | ICD-10-CM

## 2016-06-30 LAB — BETA HCG QUANT (REF LAB): HCG QUANT: 41 m[IU]/mL

## 2016-07-01 ENCOUNTER — Telehealth: Payer: Self-pay

## 2016-07-01 DIAGNOSIS — O039 Complete or unspecified spontaneous abortion without complication: Secondary | ICD-10-CM

## 2016-07-01 NOTE — Telephone Encounter (Signed)
Pt aware of Bhcg; 41. To come in next week for another Bhcg. (message was given 06/30/2016).

## 2016-07-06 ENCOUNTER — Encounter: Payer: Self-pay | Admitting: Obstetrics and Gynecology

## 2016-07-06 ENCOUNTER — Other Ambulatory Visit: Payer: Self-pay

## 2016-07-06 DIAGNOSIS — O021 Missed abortion: Secondary | ICD-10-CM

## 2016-07-07 ENCOUNTER — Other Ambulatory Visit: Payer: 59

## 2016-07-07 DIAGNOSIS — O021 Missed abortion: Secondary | ICD-10-CM | POA: Diagnosis not present

## 2016-07-08 ENCOUNTER — Encounter: Payer: Self-pay | Admitting: Obstetrics and Gynecology

## 2016-07-08 LAB — BETA HCG QUANT (REF LAB): hCG Quant: <1 m[IU]/mL

## 2016-07-16 DIAGNOSIS — Z0100 Encounter for examination of eyes and vision without abnormal findings: Secondary | ICD-10-CM | POA: Diagnosis not present

## 2016-07-24 ENCOUNTER — Other Ambulatory Visit: Payer: 59

## 2016-08-05 ENCOUNTER — Encounter: Payer: Self-pay | Admitting: Sports Medicine

## 2016-08-05 ENCOUNTER — Ambulatory Visit (INDEPENDENT_AMBULATORY_CARE_PROVIDER_SITE_OTHER): Payer: 59 | Admitting: Sports Medicine

## 2016-08-05 DIAGNOSIS — M722 Plantar fascial fibromatosis: Secondary | ICD-10-CM | POA: Diagnosis not present

## 2016-08-05 NOTE — Assessment & Plan Note (Signed)
New set of custom orthotics as above, continue rehabilitation exercises.

## 2016-08-05 NOTE — Progress Notes (Signed)

## 2016-08-24 ENCOUNTER — Encounter: Payer: Self-pay | Admitting: Obstetrics and Gynecology

## 2016-08-24 ENCOUNTER — Other Ambulatory Visit: Payer: Self-pay

## 2016-08-24 DIAGNOSIS — Z30015 Encounter for initial prescription of vaginal ring hormonal contraceptive: Secondary | ICD-10-CM

## 2016-08-24 MED ORDER — ETONOGESTREL-ETHINYL ESTRADIOL 0.12-0.015 MG/24HR VA RING: VAGINAL_RING | VAGINAL | 12 refills | Status: DC

## 2016-10-04 ENCOUNTER — Encounter: Payer: Self-pay | Admitting: Physician Assistant

## 2016-10-14 ENCOUNTER — Encounter: Payer: Self-pay | Admitting: Physician Assistant

## 2016-11-02 ENCOUNTER — Ambulatory Visit: Payer: Self-pay | Admitting: Family

## 2016-11-02 ENCOUNTER — Encounter: Payer: Self-pay | Admitting: Family

## 2016-11-02 VITALS — BP 110/70 | HR 78 | Temp 98.4°F

## 2016-11-02 DIAGNOSIS — L409 Psoriasis, unspecified: Secondary | ICD-10-CM

## 2016-11-02 DIAGNOSIS — L0100 Impetigo, unspecified: Secondary | ICD-10-CM

## 2016-11-02 MED ORDER — CLOBETASOL PROPIONATE 0.05 % EX CREA: 1.0000 "application " | TOPICAL_CREAM | Freq: Two times a day (BID) | CUTANEOUS | 0 refills | Status: DC

## 2016-11-02 MED ORDER — MUPIROCIN CALCIUM 2 % EX CREA: 1.0000 "application " | TOPICAL_CREAM | Freq: Two times a day (BID) | CUTANEOUS | 0 refills | Status: DC

## 2016-11-02 NOTE — Progress Notes (Signed)
S/ rash to elbows and chin , elbows painful due to recurrant contact on counters, lesions started on chin and are now spreading to cheeks and with exudate  Hx of psoriasis and seborhea  O/ both elbows with confluent  erythema with excoriation ,and then separate less red macular areas adjacent Chin with crusted lesions in crops and few isolated ones on both sides of  Face A/ psoriasis  Impetigo  P clobetasole cream with occlusive dx at hs follow /up with derm as planned. bactriban to facial lesions .

## 2017-02-16 ENCOUNTER — Ambulatory Visit (INDEPENDENT_AMBULATORY_CARE_PROVIDER_SITE_OTHER): Payer: 59 | Admitting: Physician Assistant

## 2017-02-16 ENCOUNTER — Encounter: Payer: Self-pay | Admitting: Physician Assistant

## 2017-02-16 VITALS — BP 106/68 | HR 66 | Temp 98.5°F | Wt 111.0 lb

## 2017-02-16 DIAGNOSIS — L233 Allergic contact dermatitis due to drugs in contact with skin: Secondary | ICD-10-CM

## 2017-02-16 DIAGNOSIS — Z8759 Personal history of other complications of pregnancy, childbirth and the puerperium: Secondary | ICD-10-CM | POA: Diagnosis not present

## 2017-02-16 DIAGNOSIS — L409 Psoriasis, unspecified: Secondary | ICD-10-CM

## 2017-02-16 MED ORDER — PREDNISONE 50 MG PO TABS: ORAL_TABLET | ORAL | 0 refills | Status: DC

## 2017-02-16 MED ORDER — CALCIPOTRIENE 0.005 % EX CREA: TOPICAL_CREAM | Freq: Two times a day (BID) | CUTANEOUS | 1 refills | Status: DC

## 2017-02-16 MED FILL — predniSONE 50 MG TABS: 50 | 5 days supply | Qty: 5 | Fill #0

## 2017-02-16 NOTE — Patient Instructions (Addendum)
-   Prednisone 50mg  daily for 5 days - Avoid make-up and topical products. Cleanse with non-soap cleanser such as Cetaphil or just warm water - Avoid Clobetasol on the face. Okay to continue on your elbows and scalp. Discontinue altogether if you develop rash - Can start Dovenox after completing Prednisone. Okay to use on body and face

## 2017-02-16 NOTE — Progress Notes (Signed)
HPI:                                                                Haley Kirby is a 31 y.o. female who presents to [facility] today for facial rash  Onset: 3 days Location: started on chin, spread to cheek/nasolabial fold Duration: constant Character: pruritic, "tight" Aggravating factors / Triggers: none Treatments tried: none  Recent illness / systemic symptoms: none  Medication / drug exposure: Clobetasol ointment Recent travel: [place], beach Animal/insect exposure: none History of allergies: none Exposure to new soaps, perfumes, cleaning products: none Exposure to chemicals: none   Patient reports she previously used Clobetasol foam for psoriasis on her elbows and ointment on her face without issues.   Past Medical History:  Diagnosis Date  . Plantar fasciitis, right 01/21/2015  . 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    . Seborrheic dermatitis of scalp 09/06/2013   No past surgical history on file. Social History  Substance Use Topics  . Smoking status: Never Smoker  . Smokeless tobacco: Never Used  . Alcohol use Yes   family history includes Diabetes in her paternal grandmother; Hyperlipidemia in her father.  ROS: negative except as noted in the HPI  Medications: Current Outpatient Prescriptions  Medication Sig Dispense Refill  . clobetasol cream (TEMOVATE) 2.77 % Apply 1 application topically 2 (two) times daily. 30 g 0  . cycloSPORINE (RESTASIS) 0.05 % ophthalmic emulsion 1 drop 2 times daily.    . Multiple Vitamin (MULTIVITAMIN) capsule Take 1 capsule by mouth daily.    . mupirocin cream (BACTROBAN) 2 % Apply 1 application topically 2 (two) times daily. To facial lesions 15 g 0  . calcipotriene (DOVONOX) 0.005 % cream Apply topically 2 (two) times daily. 60 g 1  . clobetasol (OLUX) 0.05 % topical foam   1  . etonogestrel-ethinyl estradiol (NUVARING) 0.12-0.015 MG/24HR vaginal ring Insert vaginally and  leave in place for 3 consecutive weeks, then remove for 1 week. (Patient not taking: Reported on 02/16/2017) 1 each 12  . predniSONE (DELTASONE) 50 MG tablet One tab PO daily for 5 days. 5 tablet 0   No current facility-administered medications for this visit.    No Active Allergies     Objective:  BP 106/68   Pulse 66   Temp 98.5 F (36.9 C) (Oral)   Wt 111 lb (50.3 kg)   SpO2 100%   Breastfeeding? No   BMI 20.30 kg/m  Gen: well-groomed, cooperative, not ill-appearing, no distress HEENT: face symmetric without edema, normal conjunctiva and lids, normal lips,  trachea midline Pulm: Normal work of breathing, normal phonation Neuro: alert and oriented x 3, EOM's intact, no tremor MSK: moving all extremities, normal gait and station, no peripheral edema Skin: warm, dry, intact; chin and perioral area with multiple erythematous papules with some convalescence    No results found for this or any previous visit (from the past 72 hour(s)). No results found.    Assessment and Plan: 31 y.o. female with   1. Allergic contact dermatitis due to drugs in contact with skin - predniSONE (DELTASONE) 50 MG tablet; One tab PO daily for 5 days.  Dispense: 5 tablet; Refill: 0  2. Psoriasis - discontinue Clobetasol  ointment on face - calcipotriene (DOVONOX) 0.005 % cream; Apply topically 2 (two) times daily.  Dispense: 60 g; Refill: 1   Patient education and anticipatory guidance given Patient agrees with treatment plan Follow-up as needed if symptoms worsen or fail to improve  Haley Russian PA-C

## 2017-02-17 ENCOUNTER — Encounter: Payer: Self-pay | Admitting: Physician Assistant

## 2017-02-17 DIAGNOSIS — Z8759 Personal history of other complications of pregnancy, childbirth and the puerperium: Secondary | ICD-10-CM | POA: Insufficient documentation

## 2017-02-22 ENCOUNTER — Other Ambulatory Visit: Payer: Self-pay

## 2017-02-22 DIAGNOSIS — M722 Plantar fascial fibromatosis: Secondary | ICD-10-CM

## 2017-02-22 MED ORDER — MELOXICAM 15 MG PO TABS: 15.0000 mg | ORAL_TABLET | Freq: Every day | ORAL | 0 refills | Status: DC

## 2017-02-26 ENCOUNTER — Encounter: Payer: Self-pay | Admitting: Physician Assistant

## 2017-03-02 ENCOUNTER — Encounter: Payer: Self-pay | Admitting: Sports Medicine

## 2017-03-02 ENCOUNTER — Ambulatory Visit (INDEPENDENT_AMBULATORY_CARE_PROVIDER_SITE_OTHER): Payer: 59 | Admitting: Sports Medicine

## 2017-03-02 DIAGNOSIS — L989 Disorder of the skin and subcutaneous tissue, unspecified: Secondary | ICD-10-CM | POA: Diagnosis not present

## 2017-03-02 DIAGNOSIS — D2261 Melanocytic nevi of right upper limb, including shoulder: Secondary | ICD-10-CM | POA: Diagnosis not present

## 2017-03-02 NOTE — Addendum Note (Signed)
Addended by: Huel Cote on: 03/02/2017 04:07 PM   Modules accepted: Orders

## 2017-03-02 NOTE — Progress Notes (Signed)
  Subjective:    CC: Skin lesion  HPI: Haley Kirby is a pleasant 31 year old female, over the past several weeks she's noticed an increased size in a lesion on her right dorsal forearm, she does have a strong family history of skin cancer and precancerous. Symptoms are moderate, persistent and she does desire excision today.  Past medical history:  Negative.  See flowsheet/record as well for more information.  Surgical history: Negative.  See flowsheet/record as well for more information.  Family history: Negative.  See flowsheet/record as well for more information.  Social history: Negative.  See flowsheet/record as well for more information.  Allergies, and medications have been entered into the medical record, reviewed, and no changes needed.   Review of Systems: No fevers, chills, night sweats, weight loss, chest pain, or shortness of breath.   Objective:    General: Well Developed, well nourished, and in no acute distress.  Neuro: Alert and oriented x3, extra-ocular muscles intact, sensation grossly intact.  HEENT: Normocephalic, atraumatic, pupils equal round reactive to light, neck supple, no masses, no lymphadenopathy, thyroid nonpalpable.  Skin: Warm and dry, no rashes. There is a subcentimeter raised lesion on the dorsal forearm suspicious for actinic keratosis. Cardiac: Regular rate and rhythm, no murmurs rubs or gallops, no lower extremity edema.  Respiratory: Clear to auscultation bilaterally. Not using accessory muscles, speaking in full sentences.  Procedure:  Shave biopsy right forearm 0.6 cm skin lesion Risks, benefits, and alternatives explained and consent obtained. Time out conducted. Surface prepped with alcohol. 1cc lidocaine with epinephine infiltrated in a field block. Adequate anesthesia ensured. Area prepped and draped in a sterile fashion. Excision performed with: Using a Dermablade I made a shave biopsy, and then achieved hemostasis with hyfrecation. Pt  stable.  Impression and Recommendations:    Skin lesion right forearm Shave biopsy as above, question actinic keratosis. Return as needed, awaiting dermatopathology results.

## 2017-03-02 NOTE — Assessment & Plan Note (Signed)
Shave biopsy as above, question actinic keratosis. Return as needed, awaiting dermatopathology results.

## 2017-03-05 ENCOUNTER — Ambulatory Visit: Payer: Self-pay | Admitting: Sports Medicine

## 2017-04-07 ENCOUNTER — Telehealth: Payer: Self-pay

## 2017-04-07 NOTE — Telephone Encounter (Signed)
Haley Kirby called and asked if it was ok to take meloxicam while pregnant. Please advise.

## 2017-04-07 NOTE — Telephone Encounter (Signed)
Do not use during 1st trimester and starting at [redacted] weeks pregnant. May use if needed in 2nd trimester;however, the safest pain reliever in pregnancy is tylenol.

## 2017-04-07 NOTE — Telephone Encounter (Signed)
Pt advised, she will not take Rx.

## 2017-04-28 ENCOUNTER — Telehealth: Payer: Self-pay | Admitting: *Deleted

## 2017-04-28 MED ORDER — DOXYLAMINE-PYRIDOXINE 10-10 MG PO TBEC: DELAYED_RELEASE_TABLET | ORAL | 3 refills | Status: AC

## 2017-04-28 NOTE — Telephone Encounter (Signed)
Pt called requesting something for nausea in pregnancy.  She has a NOB appt 9/25@ 3:30.  RX for Diclegis was sent to [facility].

## 2017-05-04 ENCOUNTER — Ambulatory Visit (INDEPENDENT_AMBULATORY_CARE_PROVIDER_SITE_OTHER): Payer: 59 | Admitting: Obstetrics & Gynecology

## 2017-05-04 ENCOUNTER — Encounter: Payer: Self-pay | Admitting: Obstetrics & Gynecology

## 2017-05-04 ENCOUNTER — Encounter: Payer: Self-pay | Admitting: *Deleted

## 2017-05-04 VITALS — BP 107/71 | HR 80 | Wt 110.0 lb

## 2017-05-04 DIAGNOSIS — Z3687 Encounter for antenatal screening for uncertain dates: Secondary | ICD-10-CM

## 2017-05-04 DIAGNOSIS — Z113 Encounter for screening for infections with a predominantly sexual mode of transmission: Secondary | ICD-10-CM

## 2017-05-04 DIAGNOSIS — Z3481 Encounter for supervision of other normal pregnancy, first trimester: Secondary | ICD-10-CM | POA: Diagnosis not present

## 2017-05-04 DIAGNOSIS — Z3480 Encounter for supervision of other normal pregnancy, unspecified trimester: Secondary | ICD-10-CM

## 2017-05-04 NOTE — Progress Notes (Signed)
Bedside U/S shows IUP with FHT of 165 BPM and CRL is 12.102mm  GA [redacted]w[redacted]d.  Scan perpormed by Renaldo Harrison, RN  Subjective:    Haley Kirby is a [id number] [redacted]w[redacted]d being seen today for her first obstetrical visit.  Her obstetrical history is significant for quick NSVD x2. Patient does intend to breast feed. Pregnancy history fully reviewed.  Patient reports Nasuea worse in the afternoon and evenings.  No bleeding.  No cramping..  Vitals:   05/04/17 1528  BP: 107/71  Pulse: 80  Weight: 110 lb (49.9 kg)    HISTORY: OB History  Gravida Para Term Preterm AB Living  4 2 2   1 2   SAB TAB Ectopic Multiple Live Births  1       2    # Outcome Date GA Lbr Len/2nd Weight Sex Delivery Anes PTL Lv  4 Current           3 Term 06/23/12 [redacted]w[redacted]d  6 lb 9 oz (2.977 kg) M Vag-Spont None  LIV  2 Term 08/31/06 [redacted]w[redacted]d  6 lb 8 oz (2.948 kg) F Vag-Spont None  LIV  1 SAB              Past Medical History:  Diagnosis Date  . Plantar fasciitis, right 01/21/2015  . 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    . Seborrheic dermatitis of scalp 09/06/2013   History reviewed. No pertinent surgical history. Family History  Problem Relation Age of Onset  . Hyperlipidemia Father   . Diabetes Paternal Grandmother      Exam    Uterus:     Pelvic Exam:    Perineum: No Hemorrhoids, Normal Perineum   Vulva: normal   Vagina:  normal mucosa   pH: n/a   Cervix: no cervical motion tenderness and no lesions   Adnexa: normal adnexa   Bony Pelvis: average  System: Breast:  normal appearance, no masses or tenderness   Skin: normal coloration and turgor, no rashes    Neurologic: oriented, normal   Extremities: normal strength, tone, and muscle mass   HEENT sclera clear, anicteric, oropharynx clear, no lesions, neck supple with midline trachea, thyroid without masses and trachea midline   Mouth/Teeth mucous membranes moist, pharynx normal without lesions and dental hygiene good   Neck supple and no masses   Cardiovascular:  regular rate and rhythm   Respiratory:  appears well, vitals normal, no respiratory distress, acyanotic, normal RR, chest clear, no wheezing, crepitations, rhonchi, normal symmetric air entry   Abdomen: soft, non-tender; bowel sounds normal; no masses,  no organomegaly   Urinary: urethral meatus normal      Assessment:    Pregnancy: [id number] Patient Active Problem List   Diagnosis Date Noted  . Supervision of other normal pregnancy, antepartum 05/04/2017  . Skin lesion right forearm 03/02/2017  . History of miscarriage, not currently pregnant 02/17/2017  . Plantar fasciitis, right 01/21/2015  . 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 Seborrheic dermatitis of scalp 09/06/2013        Plan:     Initial labs drawn. Prenatal vitamins. Problem list reviewed and updated. Genetic Screening discussed First Screen: ordered.  AFP 16-20 weeks  Ultrasound discussed; fetal survey: requested.  Babyscripts--Pt low risk and would like to try reduced visit product.  Pt signed up and reviewed plan with patient.   Diclegis during the day for nausea   Follow up in 4 weeks.    Haley Kirby 05/06/2017

## 2017-05-06 LAB — URINE CYTOLOGY ANCILLARY ONLY
Chlamydia: NEGATIVE
NEISSERIA GONORRHEA: NEGATIVE

## 2017-05-06 LAB — CULTURE, URINE COMPREHENSIVE
MICRO NUMBER:: 81060915
RESULT: NO GROWTH
SPECIMEN QUALITY:: ADEQUATE

## 2017-05-07 LAB — TEST AUTHORIZATION

## 2017-05-07 LAB — OBSTETRIC PANEL
Antibody Screen: NOT DETECTED
BASOS ABS: 31 {cells}/uL (ref 0–200)
Basophils Relative: 0.3 %
EOS PCT: 0.3 %
Eosinophils Absolute: 31 cells/uL (ref 15–500)
HCT: 36.5 % (ref 35.0–45.0)
Hemoglobin: 12.4 g/dL (ref 11.7–15.5)
Hepatitis B Surface Ag: NONREACTIVE
LYMPHS ABS: 1945 {cells}/uL (ref 850–3900)
MCH: 31.8 pg (ref 27.0–33.0)
MCHC: 34 g/dL (ref 32.0–36.0)
MCV: 93.6 fL (ref 80.0–100.0)
MONOS PCT: 5.5 %
MPV: 10.5 fL (ref 7.5–12.5)
NEUTROS ABS: 7821 {cells}/uL — AB (ref 1500–7800)
NEUTROS PCT: 75.2 %
PLATELETS: 317 10*3/uL (ref 140–400)
RBC: 3.9 10*6/uL (ref 3.80–5.10)
RDW: 11.3 % (ref 11.0–15.0)
RPR: NONREACTIVE
RUBELLA: 2.69 {index}
Total Lymphocyte: 18.7 %
WBC: 10.4 10*3/uL (ref 3.8–10.8)
WBCMIX: 572 {cells}/uL (ref 200–950)

## 2017-05-07 LAB — HIV ANTIBODY (ROUTINE TESTING W REFLEX): HIV: NONREACTIVE

## 2017-05-08 DIAGNOSIS — Z3480 Encounter for supervision of other normal pregnancy, unspecified trimester: Secondary | ICD-10-CM

## 2017-05-17 ENCOUNTER — Encounter: Payer: Self-pay | Admitting: Obstetrics & Gynecology

## 2017-05-26 ENCOUNTER — Other Ambulatory Visit: Payer: 59

## 2017-05-26 DIAGNOSIS — Z20828 Contact with and (suspected) exposure to other viral communicable diseases: Secondary | ICD-10-CM | POA: Diagnosis not present

## 2017-05-26 LAB — TIQ-NTM

## 2017-05-30 LAB — TEST AUTHORIZATION

## 2017-05-30 LAB — COXSACKIE A VIRUS ANTIBODIES
Coxsackie A2 Ab: <1:8 {titer}
Coxsackie A4 Ab: <1:8 {titer}
Coxsackie A7 Ab: <1:8 {titer}
Coxsackie A9 Ab: <1:8 {titer}

## 2017-05-30 LAB — COXSACKIE B VIRUS ANTIBODIES
COXSACKIE  B4 AB: <1:8 {titer}
COXSACKIE  B5 AB: 1:16 {titer} — ABNORMAL HIGH
COXSACKIE  B6 AB: <1:8 {titer}

## 2017-05-30 LAB — ECHOVIRUS ABS PANEL (CSF)

## 2017-05-31 ENCOUNTER — Encounter: Payer: Self-pay | Admitting: Advanced Practice Midwife

## 2017-05-31 ENCOUNTER — Telehealth: Payer: Self-pay | Admitting: *Deleted

## 2017-05-31 DIAGNOSIS — Z209 Contact with and (suspected) exposure to unspecified communicable disease: Secondary | ICD-10-CM | POA: Insufficient documentation

## 2017-05-31 NOTE — Telephone Encounter (Signed)
Pt notified of [place] results.  Per Loura Back she will need a rpt blood test in 1 month.  If levels are going up then she will need U/S's to monitor.

## 2017-06-01 ENCOUNTER — Encounter: Payer: Self-pay | Admitting: Obstetrics & Gynecology

## 2017-06-08 ENCOUNTER — Ambulatory Visit (INDEPENDENT_AMBULATORY_CARE_PROVIDER_SITE_OTHER): Payer: 59 | Admitting: Obstetrics & Gynecology

## 2017-06-08 VITALS — BP 115/77 | HR 87 | Wt 106.0 lb

## 2017-06-08 DIAGNOSIS — R634 Abnormal weight loss: Secondary | ICD-10-CM

## 2017-06-08 DIAGNOSIS — Z3490 Encounter for supervision of normal pregnancy, unspecified, unspecified trimester: Secondary | ICD-10-CM

## 2017-06-08 DIAGNOSIS — Z3481 Encounter for supervision of other normal pregnancy, first trimester: Secondary | ICD-10-CM | POA: Diagnosis not present

## 2017-06-08 NOTE — Progress Notes (Signed)
   PRENATAL VISIT NOTE  Subjective:  Kemari Mares is a 31 y.o. [id number] at [redacted]w[redacted]d being seen today for ongoing prenatal care.  She is currently monitored for the following issues for this low-risk pregnancy and has Seborrheic dermatitis of scalp; Scalp psoriasis; Plantar fasciitis, right; History of miscarriage, not currently pregnant; Skin lesion right forearm; Supervision of other normal pregnancy, antepartum; and Exposure to communicable disease on her problem list.  Patient reports nausea and vomiting.  Contractions: Not present. Vag. Bleeding: None.  Movement: Absent. Denies leaking of fluid.   The following portions of the patient's history were reviewed and updated as appropriate: allergies, current medications, past family history, past medical history, past social history, past surgical history and problem list. Problem list updated.  Objective:   Vitals:   06/08/17 1559  BP: 115/77  Pulse: 87  Weight: 106 lb (48.1 kg)    Fetal Status: Fetal Heart Rate (bpm): 150   Movement: Absent     General:  Alert, oriented and cooperative. Patient is in no acute distress.  Skin: Skin is warm and dry. No rash noted.   Cardiovascular: Normal heart rate noted  Respiratory: Normal respiratory effort, no problems with respiration noted  Abdomen: Soft, gravid, appropriate for gestational age.  Pain/Pressure: Absent     Pelvic: Cervical exam deferred        Extremities: Normal range of motion.  Edema: None  Mental Status:  Normal mood and affect. Normal behavior. Normal judgment and thought content.   Assessment and Plan:  Pregnancy: [id number] at [redacted]w[redacted]d  1. Prenatal care, antepartum - [place] MFM OB COMP + 14 WK; Future - Babyscripts data not flowing nad company [employer].  Pt states she is compliant. - AFP at next visit.  2.  Weight loss Pt having nausea and vomiting.  Lost 4 pounds in 4 weeks.  Pt to try Ensure to help with caloric intake.  Continue diglegis.  Patient does not want  any other medications.  Preterm labor symptoms and general obstetric precautions including but not limited to vaginal bleeding, contractions, leaking of fluid and fetal movement were reviewed in detail with the patient. Please refer to After Visit Summary for other counseling recommendations.  Return in about 8 years (around 06/08/2025).   Silas Sacramento, MD

## 2017-06-10 ENCOUNTER — Ambulatory Visit (HOSPITAL_COMMUNITY)
Admission: RE | Admit: 2017-06-10 | Discharge: 2017-06-10 | Disposition: A | Discharge status: Home / Self Care | Payer: 59 | Source: Ambulatory Visit | Attending: Certified Nurse Midwife | Admitting: Certified Nurse Midwife

## 2017-06-10 ENCOUNTER — Ambulatory Visit (HOSPITAL_COMMUNITY)
Admission: RE | Admit: 2017-06-10 | Discharge: 2017-06-10 | Disposition: A | Discharge status: Home / Self Care | Payer: 59 | Source: Ambulatory Visit | Attending: Obstetrics & Gynecology | Admitting: Obstetrics & Gynecology

## 2017-06-10 ENCOUNTER — Encounter (HOSPITAL_COMMUNITY): Payer: Self-pay

## 2017-06-10 ENCOUNTER — Other Ambulatory Visit: Payer: Self-pay | Admitting: Obstetrics & Gynecology

## 2017-06-10 DIAGNOSIS — Z3682 Encounter for antenatal screening for nuchal translucency: Secondary | ICD-10-CM | POA: Diagnosis not present

## 2017-06-10 DIAGNOSIS — Z3A12 12 weeks gestation of pregnancy: Secondary | ICD-10-CM | POA: Diagnosis not present

## 2017-06-10 DIAGNOSIS — Z3A18 18 weeks gestation of pregnancy: Secondary | ICD-10-CM | POA: Diagnosis not present

## 2017-06-10 DIAGNOSIS — Z36 Encounter for antenatal screening for chromosomal anomalies: Secondary | ICD-10-CM | POA: Diagnosis not present

## 2017-06-10 DIAGNOSIS — Z3481 Encounter for supervision of other normal pregnancy, first trimester: Secondary | ICD-10-CM

## 2017-06-10 DIAGNOSIS — Z3480 Encounter for supervision of other normal pregnancy, unspecified trimester: Secondary | ICD-10-CM

## 2017-06-14 ENCOUNTER — Ambulatory Visit (HOSPITAL_COMMUNITY): Payer: 59

## 2017-06-17 ENCOUNTER — Encounter: Payer: Self-pay | Admitting: *Deleted

## 2017-06-21 ENCOUNTER — Other Ambulatory Visit: Payer: 59

## 2017-06-21 DIAGNOSIS — Z209 Contact with and (suspected) exposure to unspecified communicable disease: Secondary | ICD-10-CM

## 2017-06-23 ENCOUNTER — Other Ambulatory Visit: Payer: Self-pay

## 2017-06-24 ENCOUNTER — Encounter: Payer: Self-pay | Admitting: Obstetrics & Gynecology

## 2017-06-24 LAB — COXSACKIE B VIRUS ANTIBODIES
Coxsackie B1 Ab: <1:8 {titer}
Coxsackie B2 Ab: 1:8 {titer} — ABNORMAL HIGH
Coxsackie B6 Ab: 1:8 {titer} — ABNORMAL HIGH

## 2017-07-05 ENCOUNTER — Encounter: Payer: Self-pay | Admitting: Obstetrics & Gynecology

## 2017-07-05 NOTE — Addendum Note (Signed)
Addended by: Lyndal Rainbow on: 07/05/2017 04:47 PM   Modules accepted: Orders

## 2017-07-07 ENCOUNTER — Encounter: Payer: Self-pay | Admitting: Physician Assistant

## 2017-07-07 ENCOUNTER — Other Ambulatory Visit (INDEPENDENT_AMBULATORY_CARE_PROVIDER_SITE_OTHER): Payer: 59

## 2017-07-07 DIAGNOSIS — Z3480 Encounter for supervision of other normal pregnancy, unspecified trimester: Secondary | ICD-10-CM | POA: Diagnosis not present

## 2017-07-07 NOTE — Progress Notes (Signed)
Pt here for AFP draw today.  She is changing jobs from [employer] to [employer].  She has to transfer care due to new insurance and being in network.

## 2017-07-08 LAB — ALPHA FETOPROTEIN, MATERNAL
AFP MoM: 0.56
AFP, Serum: 24.8 ng/mL
Calc'd Gestational Age: 16.7 weeks
Maternal Wt: 110 [lb_av]
Twins-AFP: 1

## 2017-07-14 ENCOUNTER — Encounter: Payer: Self-pay | Admitting: Physician Assistant

## 2017-07-16 ENCOUNTER — Encounter: Payer: Self-pay | Admitting: Obstetrics & Gynecology

## 2017-07-16 MED ORDER — CALCIPOTRIENE 0.005 % EX SOLN: 1.0000 "application " | Freq: Two times a day (BID) | CUTANEOUS | 3 refills | Status: AC

## 2017-07-22 DIAGNOSIS — Z0100 Encounter for examination of eyes and vision without abnormal findings: Secondary | ICD-10-CM | POA: Diagnosis not present

## 2017-07-23 ENCOUNTER — Ambulatory Visit
Admission: RE | Admit: 2017-07-23 | Discharge: 2017-07-23 | Disposition: A | Discharge status: Home / Self Care | Payer: 59 | Source: Ambulatory Visit | Attending: Obstetrics & Gynecology | Admitting: Obstetrics & Gynecology

## 2017-07-23 DIAGNOSIS — Z3A18 18 weeks gestation of pregnancy: Secondary | ICD-10-CM | POA: Insufficient documentation

## 2017-07-23 DIAGNOSIS — Z3689 Encounter for other specified antenatal screening: Secondary | ICD-10-CM | POA: Diagnosis not present

## 2017-07-23 DIAGNOSIS — Z3492 Encounter for supervision of normal pregnancy, unspecified, second trimester: Secondary | ICD-10-CM | POA: Diagnosis not present

## 2017-07-23 DIAGNOSIS — Z3490 Encounter for supervision of normal pregnancy, unspecified, unspecified trimester: Secondary | ICD-10-CM | POA: Diagnosis present

## 2017-07-27 ENCOUNTER — Telehealth: Payer: Self-pay | Admitting: *Deleted

## 2017-07-27 NOTE — Telephone Encounter (Signed)
Pre Authorization sent to cover my meds.[id number])

## 2017-07-28 ENCOUNTER — Encounter: Payer: Self-pay | Admitting: Obstetrics & Gynecology

## 2017-07-28 DIAGNOSIS — Z043 Encounter for examination and observation following other accident: Secondary | ICD-10-CM | POA: Diagnosis not present

## 2017-07-28 DIAGNOSIS — Z041 Encounter for examination and observation following transport accident: Secondary | ICD-10-CM | POA: Diagnosis not present

## 2017-07-28 DIAGNOSIS — O2692 Pregnancy related conditions, unspecified, second trimester: Secondary | ICD-10-CM | POA: Diagnosis not present

## 2017-07-28 DIAGNOSIS — Z3482 Encounter for supervision of other normal pregnancy, second trimester: Secondary | ICD-10-CM | POA: Diagnosis not present

## 2017-07-28 DIAGNOSIS — S39012A Strain of muscle, fascia and tendon of lower back, initial encounter: Secondary | ICD-10-CM | POA: Diagnosis not present

## 2017-07-28 DIAGNOSIS — Z3A19 19 weeks gestation of pregnancy: Secondary | ICD-10-CM | POA: Diagnosis not present

## 2017-07-28 DIAGNOSIS — O26892 Other specified pregnancy related conditions, second trimester: Secondary | ICD-10-CM | POA: Diagnosis not present

## 2017-07-29 ENCOUNTER — Telehealth: Payer: Self-pay | Admitting: *Deleted

## 2017-07-29 MED ORDER — CYCLOBENZAPRINE HCL 10 MG PO TABS: 10.0000 mg | ORAL_TABLET | Freq: Three times a day (TID) | ORAL | 0 refills | Status: AC | PRN

## 2017-07-29 NOTE — Telephone Encounter (Signed)
Pt emailed me stating that [place] on [address].  She went to [facility] to be checked out.  Pt does have an appt next week with our office and then she will be transferring to [facility] because she has switched jobs and will be out of network.
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
## 2017-07-30 ENCOUNTER — Ambulatory Visit (HOSPITAL_COMMUNITY): Payer: 59

## 2017-07-30 NOTE — Telephone Encounter (Signed)
Medication has been approved. Patient notified.

## 2017-08-05 ENCOUNTER — Ambulatory Visit (INDEPENDENT_AMBULATORY_CARE_PROVIDER_SITE_OTHER): Payer: 59 | Admitting: Obstetrics & Gynecology

## 2017-08-05 ENCOUNTER — Encounter: Payer: Self-pay | Admitting: Obstetrics & Gynecology

## 2017-08-05 VITALS — BP 106/74 | HR 78 | Wt 113.0 lb

## 2017-08-05 DIAGNOSIS — Z3482 Encounter for supervision of other normal pregnancy, second trimester: Secondary | ICD-10-CM | POA: Diagnosis not present

## 2017-08-05 DIAGNOSIS — Z3480 Encounter for supervision of other normal pregnancy, unspecified trimester: Secondary | ICD-10-CM

## 2017-08-05 DIAGNOSIS — Z0000 Encounter for general adult medical examination without abnormal findings: Secondary | ICD-10-CM | POA: Diagnosis not present

## 2017-08-05 NOTE — Progress Notes (Signed)
   PRENATAL VISIT NOTE  Subjective:  Haley Kirby is a 31 y.o. [id number] at [redacted]w[redacted]d being seen today for ongoing prenatal care.  She is currently monitored for the following issues for this low-risk pregnancy and has Seborrheic dermatitis of scalp; Scalp psoriasis; Plantar fasciitis, right; History of miscarriage, not currently pregnant; Skin lesion right forearm; Supervision of other normal pregnancy, antepartum; Exposure to communicable disease; and Weight loss, unintentional on their problem list.  Patient reports no complaints.  Contractions: Not present. Vag. Bleeding: None.  Movement: Present. Denies leaking of fluid.   The following portions of the patient's history were reviewed and updated as appropriate: allergies, current medications, past family history, past medical history, past social history, past surgical history and problem list. Problem list updated.  Objective:   Vitals:   08/05/17 1537  BP: 106/74  Pulse: 78  Weight: 113 lb (51.3 kg)    Fetal Status: Fetal Heart Rate (bpm): 145   Movement: Present     General:  Alert, oriented and cooperative. Patient is in no acute distress.  Skin: Skin is warm and dry. No rash noted.   Cardiovascular: Normal heart rate noted  Respiratory: Normal respiratory effort, no problems with respiration noted  Abdomen: Soft, gravid, appropriate for gestational age.  Pain/Pressure: Absent     Pelvic: Cervical exam deferred        Extremities: Normal range of motion.  Edema: None  Mental Status:  Normal mood and affect. Normal behavior. Normal judgment and thought content.   Assessment and Plan:  Pregnancy: [id number] at [redacted]w[redacted]d  1. Supervision of other normal pregnancy, antepartum - We discussed ACOG rec for 20 pound weight gain.  She declines nutritionist visit at this point.  2. Preventative health care  - TSH  Preterm labor symptoms and general obstetric precautions including but not limited to vaginal bleeding, contractions, leaking of  fluid and fetal movement were reviewed in detail with the patient. Please refer to After Visit Summary for other counseling recommendations.  No Follow-up on file.   Emily Filbert, MD

## 2017-08-06 LAB — TSH: TSH: 3.19 m[IU]/L

## 2017-08-10 ENCOUNTER — Inpatient Hospital Stay (HOSPITAL_COMMUNITY): Admission: AD | Admit: 2017-08-10 | Payer: 59 | Source: Ambulatory Visit | Admitting: Obstetrics & Gynecology

## 2017-12-20 IMAGING — US US OB COMP +14 WK
1 series · 14 of 28 positions shown · non-contrast
Comparison: none

CLINICAL DATA: Prenatal care.  Fetal anatomy evaluation.

EXAM:
OBSTETRICAL ULTRASOUND >14 WKS

[Series 1: us ob comp +14 wk · 0.20mm/px · 14 of 80 slices shown]
[im 3/80]
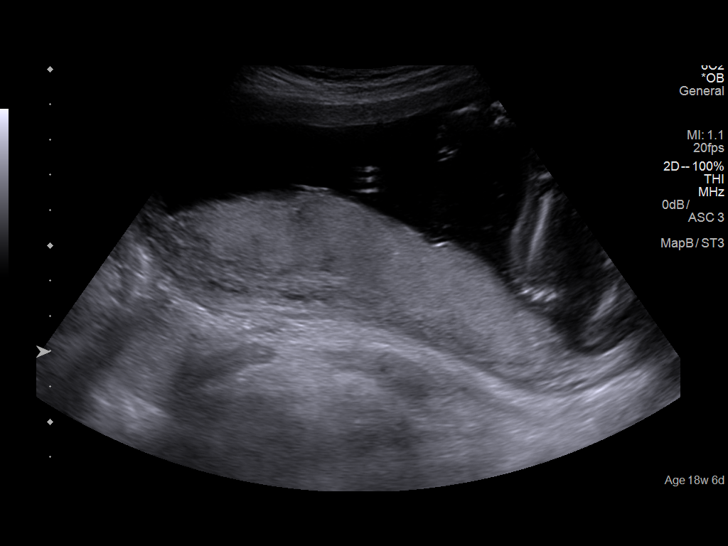
[im 9/80]
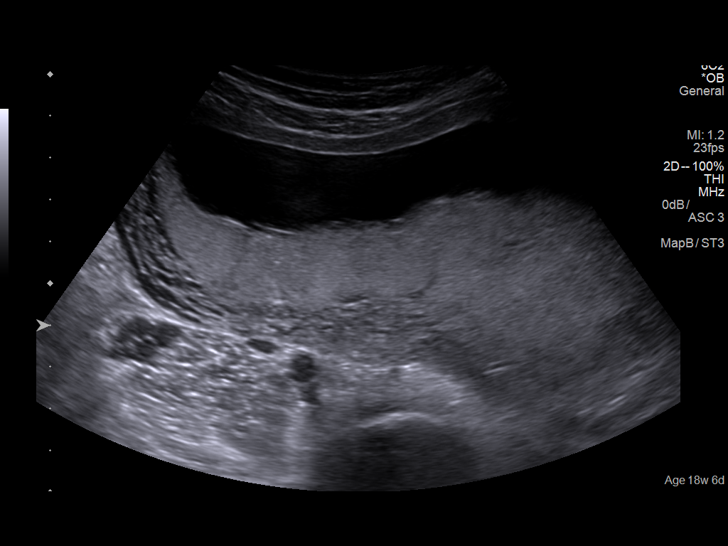
[im 15/80]
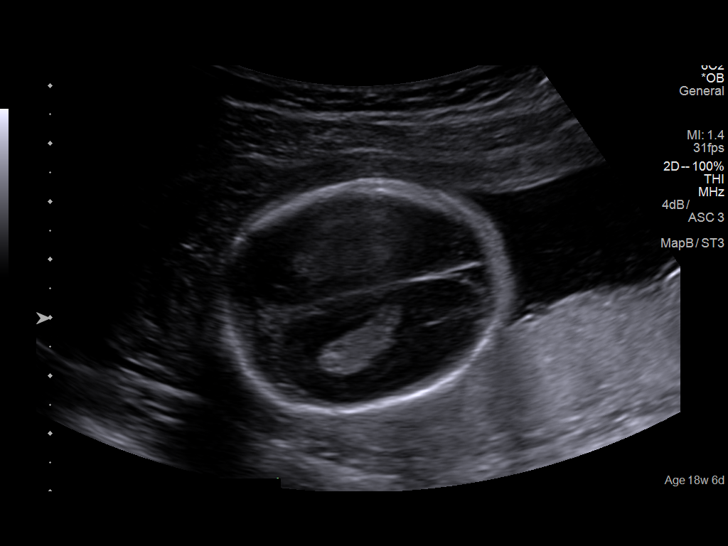
[im 21/80]
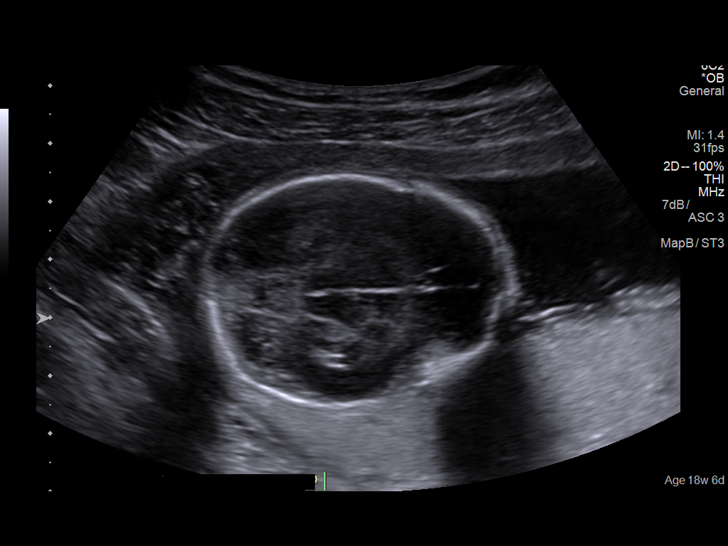
[im 27/80]
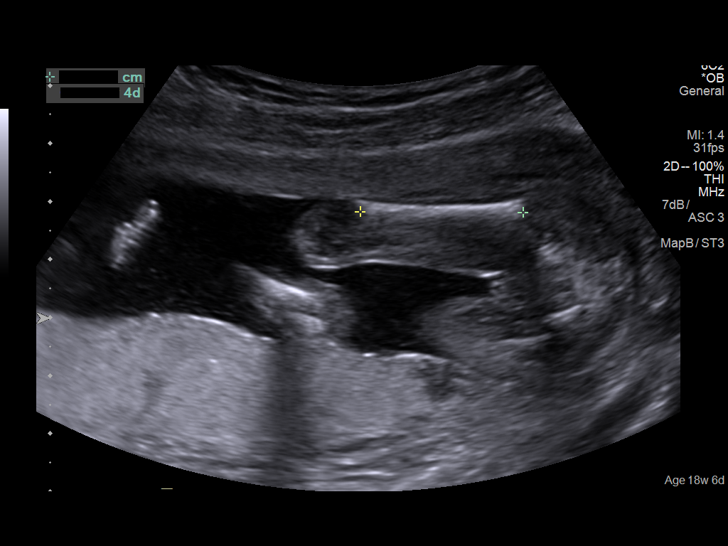
[im 33/80]
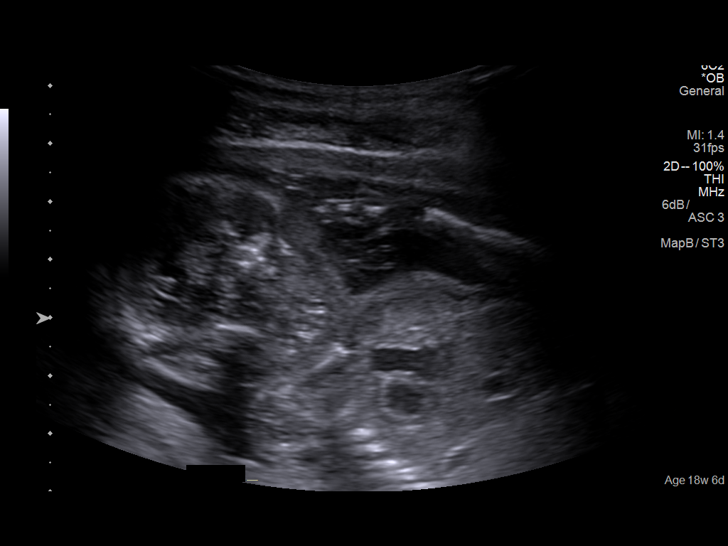
[im 39/80]
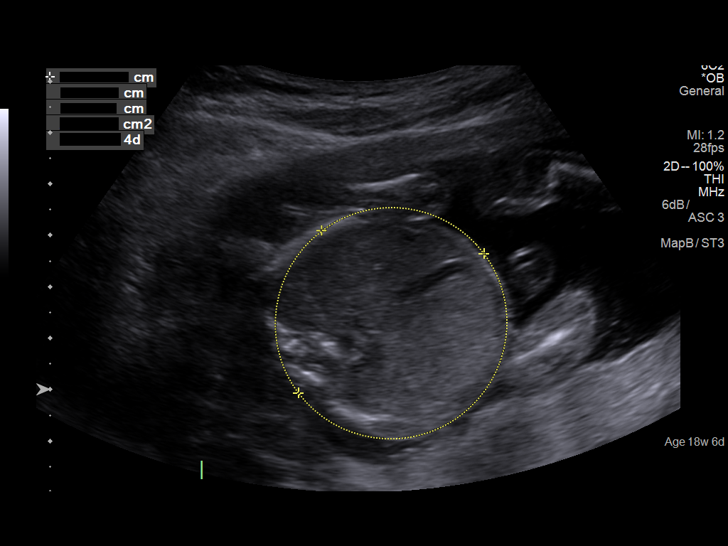
[im 44/80]
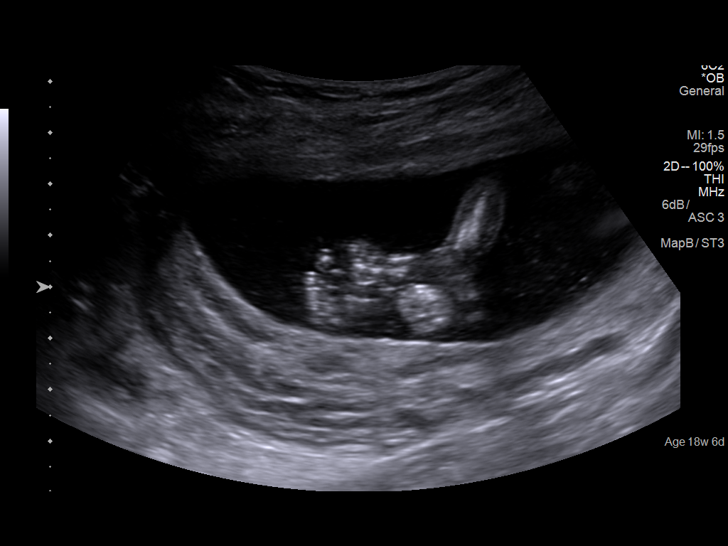
[im 50/80]
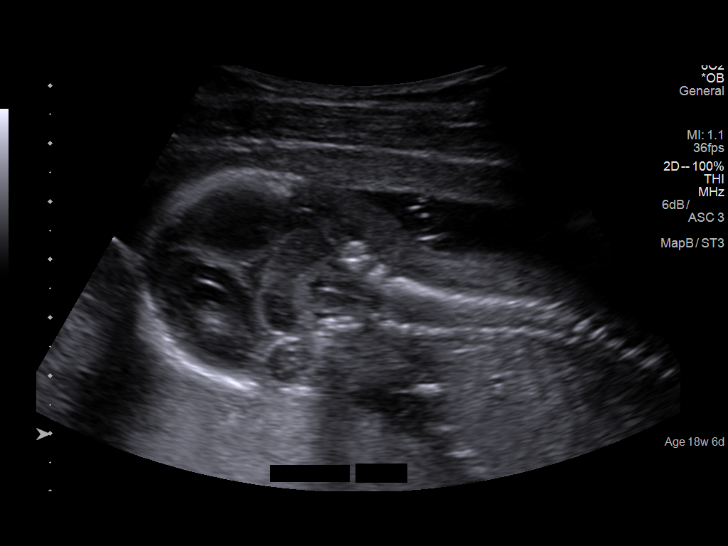
[im 56/80]
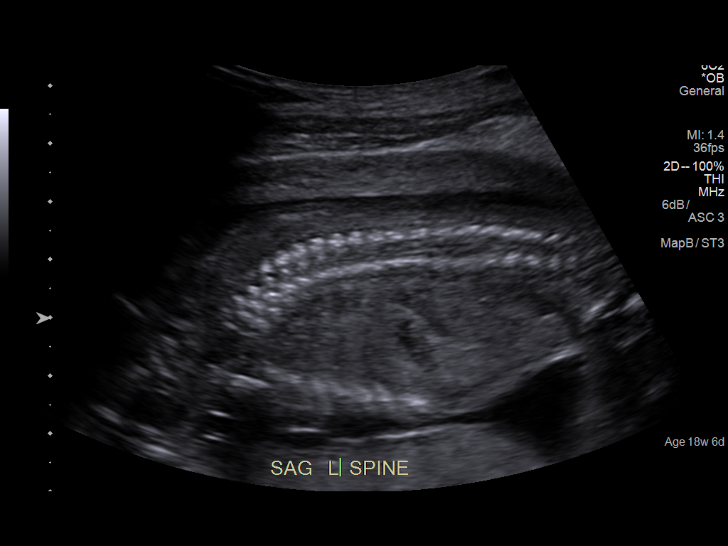
[im 62/80]
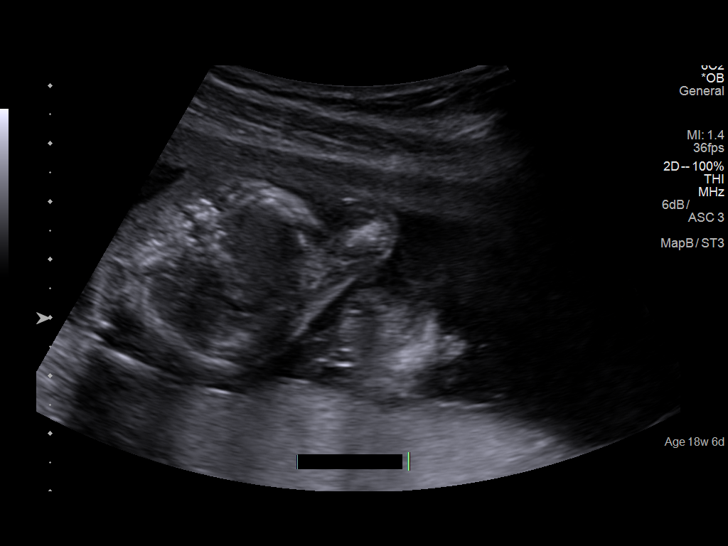
[im 68/80]
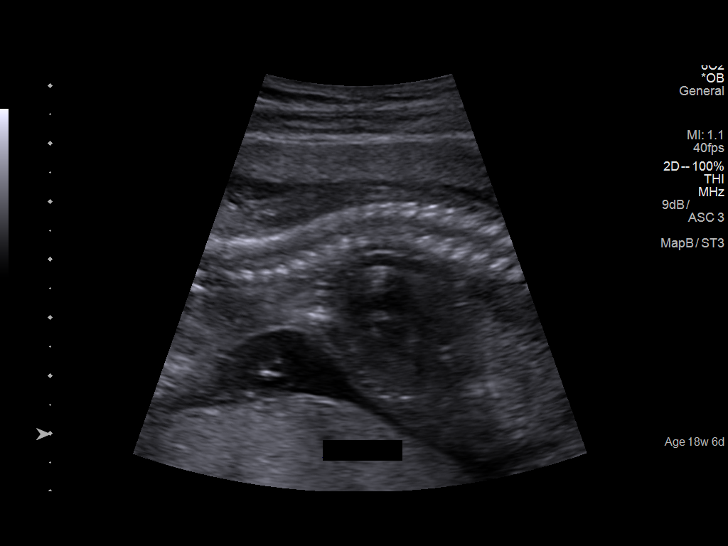
[im 74/80]
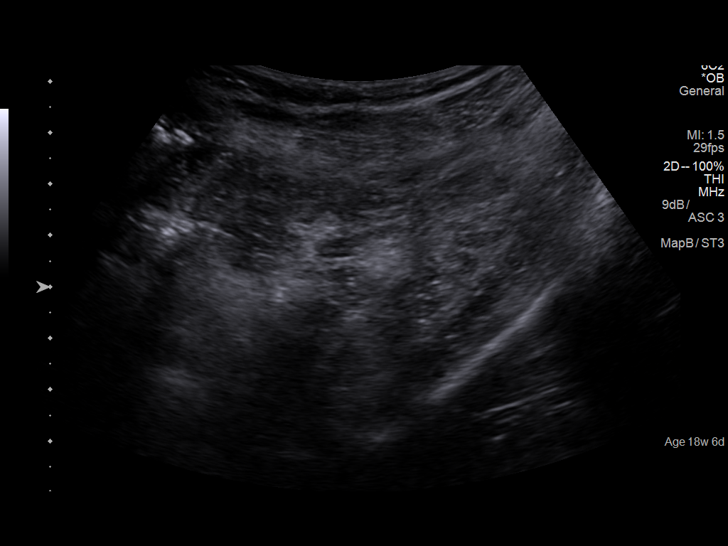
[im 80/80]
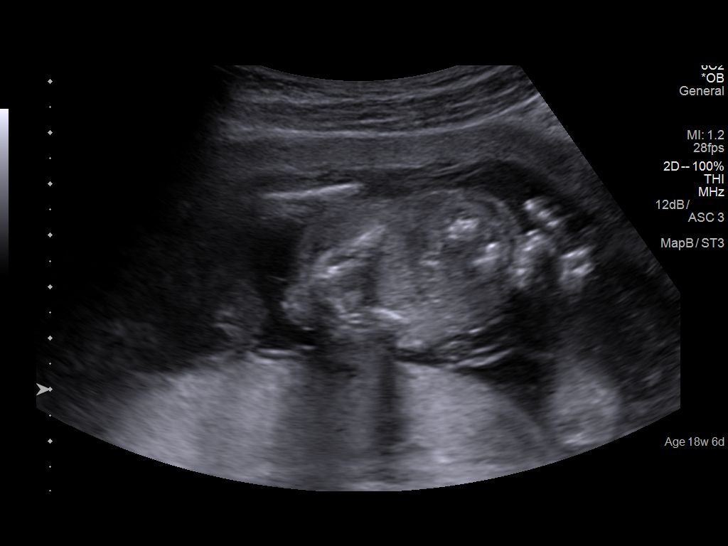

[14 of 28 positions shown; findings below may reference images not displayed]

FINDINGS: Number of Fetuses: 1

Heart Rate:  149 bpm

Movement: Present

Presentation: Variable

Previa: None

Placental Location: Posterior

Amniotic Fluid (Subjective): Normal

Amniotic Fluid (Objective):

Vertical pocket 2.3 cmcm

FETAL BIOMETRY

BPD:  4.0cm 18w 1d

HC:    15.4cm  18w   3d

AC:   12.3cm  17w   6d

FL:   2.8cm  18w   4d

Current Mean GA: 18w 3d              US EDC: 12/21/2017

Estimated Fetal Weight:  230g    15%ile

FETAL ANATOMY

Lateral Ventricles: Visualized

Thalami/CSP: Visualized

Posterior Fossa:  Visualized

Nuchal Region: Visualized    NFT= 3.6mm

Upper Lip: Visualized

Spine: Visualized

4 Chamber Heart on Left: Visualized

LVOT: Visualized

RVOT: Visualized

Stomach on Left: Visualized

3 Vessel Cord: Visualized

Cord Insertion site: Visualized

Kidneys: Visualized

Bladder: Visualized

Extremities: Visualized

Maternal Findings:

Cervix:  5.1 cm and closed.
IMPRESSION: Single viable intrauterine pregnancy at 18 weeks 3 days. Variable
presentation.

## 2018-01-12 ENCOUNTER — Encounter: Payer: Self-pay | Admitting: Physician Assistant

## 2018-03-12 ENCOUNTER — Encounter (HOSPITAL_COMMUNITY): Payer: Self-pay
